# Patient Record
Sex: Female | Born: 1965 | Race: White | Hispanic: No | Marital: Married | Smoking: Never smoker
Health system: Southern US, Community
[De-identification: ages and names within clinical notes are randomized; demographics above are authoritative.]

## PROBLEM LIST (undated history)

## (undated) DIAGNOSIS — N83209 Unspecified ovarian cyst, unspecified side: Secondary | ICD-10-CM

## (undated) DIAGNOSIS — I10 Essential (primary) hypertension: Secondary | ICD-10-CM

## (undated) DIAGNOSIS — N809 Endometriosis, unspecified: Secondary | ICD-10-CM

## (undated) DIAGNOSIS — J309 Allergic rhinitis, unspecified: Secondary | ICD-10-CM

## (undated) DIAGNOSIS — F419 Anxiety disorder, unspecified: Secondary | ICD-10-CM

## (undated) DIAGNOSIS — L309 Dermatitis, unspecified: Secondary | ICD-10-CM

## (undated) HISTORY — DX: Unspecified ovarian cyst, unspecified side: N83.209

## (undated) HISTORY — DX: Endometriosis, unspecified: N80.9

## (undated) HISTORY — DX: Anxiety disorder, unspecified: F41.9

## (undated) HISTORY — DX: Allergic rhinitis, unspecified: J30.9

## (undated) HISTORY — DX: Essential (primary) hypertension: I10

## (undated) HISTORY — DX: Dermatitis, unspecified: L30.9

---

## 2002-10-11 ENCOUNTER — Ambulatory Visit (HOSPITAL_COMMUNITY): Admission: RE | Admit: 2002-10-11 | Discharge: 2002-10-11 | Payer: Self-pay | Admitting: Obstetrics and Gynecology

## 2002-10-11 ENCOUNTER — Encounter: Payer: Self-pay | Admitting: Obstetrics and Gynecology

## 2003-01-14 ENCOUNTER — Encounter: Admission: RE | Admit: 2003-01-14 | Discharge: 2003-01-14 | Payer: Self-pay | Admitting: Obstetrics and Gynecology

## 2003-01-20 ENCOUNTER — Ambulatory Visit (HOSPITAL_COMMUNITY): Admission: RE | Admit: 2003-01-20 | Discharge: 2003-01-20 | Payer: Self-pay | Admitting: Obstetrics and Gynecology

## 2003-01-20 ENCOUNTER — Encounter: Payer: Self-pay | Admitting: Obstetrics and Gynecology

## 2003-02-17 ENCOUNTER — Ambulatory Visit (HOSPITAL_COMMUNITY): Admission: RE | Admit: 2003-02-17 | Discharge: 2003-02-17 | Payer: Self-pay | Admitting: Obstetrics and Gynecology

## 2003-03-05 ENCOUNTER — Inpatient Hospital Stay (HOSPITAL_COMMUNITY): Admission: AD | Admit: 2003-03-05 | Discharge: 2003-03-05 | Payer: Self-pay | Admitting: Obstetrics and Gynecology

## 2003-03-06 ENCOUNTER — Inpatient Hospital Stay (HOSPITAL_COMMUNITY): Admission: AD | Admit: 2003-03-06 | Discharge: 2003-03-08 | Payer: Self-pay | Admitting: Obstetrics and Gynecology

## 2003-06-24 ENCOUNTER — Other Ambulatory Visit: Admission: RE | Admit: 2003-06-24 | Discharge: 2003-06-24 | Payer: Self-pay | Admitting: Obstetrics and Gynecology

## 2004-06-02 ENCOUNTER — Ambulatory Visit (HOSPITAL_COMMUNITY): Admission: RE | Admit: 2004-06-02 | Discharge: 2004-06-02 | Payer: Self-pay | Admitting: Obstetrics and Gynecology

## 2004-07-02 ENCOUNTER — Other Ambulatory Visit: Admission: RE | Admit: 2004-07-02 | Discharge: 2004-07-02 | Payer: Self-pay | Admitting: Obstetrics and Gynecology

## 2004-08-06 ENCOUNTER — Ambulatory Visit (HOSPITAL_COMMUNITY): Admission: RE | Admit: 2004-08-06 | Discharge: 2004-08-06 | Payer: Self-pay | Admitting: Obstetrics and Gynecology

## 2004-12-16 ENCOUNTER — Inpatient Hospital Stay (HOSPITAL_COMMUNITY): Admission: AD | Admit: 2004-12-16 | Discharge: 2004-12-16 | Payer: Self-pay | Admitting: Obstetrics and Gynecology

## 2004-12-18 ENCOUNTER — Inpatient Hospital Stay (HOSPITAL_COMMUNITY): Admission: AD | Admit: 2004-12-18 | Discharge: 2004-12-18 | Payer: Self-pay | Admitting: Obstetrics and Gynecology

## 2004-12-29 ENCOUNTER — Inpatient Hospital Stay (HOSPITAL_COMMUNITY): Admission: AD | Admit: 2004-12-29 | Discharge: 2004-12-29 | Payer: Self-pay | Admitting: Obstetrics and Gynecology

## 2005-01-02 ENCOUNTER — Inpatient Hospital Stay (HOSPITAL_COMMUNITY): Admission: AD | Admit: 2005-01-02 | Discharge: 2005-01-04 | Payer: Self-pay | Admitting: Obstetrics and Gynecology

## 2010-06-16 ENCOUNTER — Ambulatory Visit: Payer: Self-pay | Admitting: General Practice

## 2012-01-23 ENCOUNTER — Encounter: Payer: Self-pay | Admitting: Obstetrics and Gynecology

## 2012-06-13 ENCOUNTER — Ambulatory Visit: Payer: Self-pay | Admitting: Obstetrics and Gynecology

## 2012-07-03 ENCOUNTER — Ambulatory Visit: Payer: Self-pay | Admitting: Medical

## 2012-12-18 ENCOUNTER — Encounter: Payer: Self-pay | Admitting: Family Medicine

## 2012-12-25 ENCOUNTER — Ambulatory Visit (INDEPENDENT_AMBULATORY_CARE_PROVIDER_SITE_OTHER): Payer: BC Managed Care – PPO | Admitting: Family Medicine

## 2012-12-25 ENCOUNTER — Encounter: Payer: Self-pay | Admitting: Family Medicine

## 2012-12-25 VITALS — BP 138/84 | Temp 98.2°F | Wt 182.1 lb

## 2012-12-25 DIAGNOSIS — L259 Unspecified contact dermatitis, unspecified cause: Secondary | ICD-10-CM

## 2012-12-25 DIAGNOSIS — J309 Allergic rhinitis, unspecified: Secondary | ICD-10-CM

## 2012-12-25 DIAGNOSIS — Q828 Other specified congenital malformations of skin: Secondary | ICD-10-CM

## 2012-12-25 DIAGNOSIS — I10 Essential (primary) hypertension: Secondary | ICD-10-CM

## 2012-12-25 DIAGNOSIS — L309 Dermatitis, unspecified: Secondary | ICD-10-CM

## 2012-12-25 DIAGNOSIS — J302 Other seasonal allergic rhinitis: Secondary | ICD-10-CM

## 2012-12-25 DIAGNOSIS — R002 Palpitations: Secondary | ICD-10-CM

## 2012-12-25 DIAGNOSIS — L858 Other specified epidermal thickening: Secondary | ICD-10-CM

## 2012-12-25 MED ORDER — FLUTICASONE PROPIONATE 50 MCG/ACT NA SUSP: 1.0000 | Freq: Every day | NASAL | Status: DC

## 2012-12-25 MED ORDER — PIMECROLIMUS 1 % EX CREA: TOPICAL_CREAM | Freq: Two times a day (BID) | CUTANEOUS | Status: DC

## 2012-12-25 MED ORDER — SALICYLIC ACID-CLEANSER 6 % LOTION EX KIT: PACK | CUTANEOUS | Status: DC

## 2012-12-25 MED ORDER — TRIAMTERENE-HCTZ 37.5-25 MG PO TABS: 1.0000 | ORAL_TABLET | Freq: Every day | ORAL | Status: DC

## 2012-12-25 MED ORDER — METOPROLOL SUCCINATE ER 25 MG PO TB24: 25.0000 mg | ORAL_TABLET | Freq: Every day | ORAL | Status: DC

## 2012-12-25 NOTE — Patient Instructions (Addendum)
Allergic Rhinitis Allergic rhinitis is when the mucous membranes in the nose respond to allergens. Allergens are particles in the air that cause your body to have an allergic reaction. This causes you to release allergic antibodies. Through a chain of events, these eventually cause you to release histamine into the blood stream (hence the use of antihistamines). Although meant to be protective to the body, it is this release that causes your discomfort, such as frequent sneezing, congestion and an itchy runny nose.  CAUSES  The pollen allergens may come from grasses, trees, and weeds. This is seasonal allergic rhinitis, or "hay fever." Other allergens cause year-round allergic rhinitis (perennial allergic rhinitis) such as house dust mite allergen, pet dander and mold spores.  SYMPTOMS   Nasal stuffiness (congestion).  Runny, itchy nose with sneezing and tearing of the eyes.  There is often an itching of the mouth, eyes and ears. It cannot be cured, but it can be controlled with medications. DIAGNOSIS  If you are unable to determine the offending allergen, skin or blood testing may find it. TREATMENT   Avoid the allergen.  Medications and allergy shots (immunotherapy) can help.  Hay fever may often be treated with antihistamines in pill or nasal spray forms. Antihistamines block the effects of histamine. There are over-the-counter medicines that may help with nasal congestion and swelling around the eyes. Check with your caregiver before taking or giving this medicine. If the treatment above does not work, there are many new medications your caregiver can prescribe. Stronger medications may be used if initial measures are ineffective. Desensitizing injections can be used if medications and avoidance fails. Desensitization is when a patient is given ongoing shots until the body becomes less sensitive to the allergen. Make sure you follow up with your caregiver if problems continue. SEEK MEDICAL  CARE IF:   You develop fever (more than 100.5 F (38.1 C).  You develop a cough that does not stop easily (persistent).  You have shortness of breath.  You start wheezing.  Symptoms interfere with normal daily activities. Document Released: 12/21/2000 Document Revised: 06/20/2011 Document Reviewed: 07/02/2008 ExitCare Patient Information 2014 [employer].  

## 2012-12-25 NOTE — Progress Notes (Signed)
Subjective:    Patient ID: Tracey Mcdaniel, female    DOB: [birth year], 47 y.o.   MRN: [id number]  HPI Pt here for refills on her medicines. She has seen dr. Milford Cage in the past for medication management. 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 She is feeling well today and has no concerns aside from needing meds filled.   1 - keratosis pilaris - uses salicyclic acid cream but would like to switch to lotion. No side effects.  2 - eczema - controlled with lotion and elidel  3 - h/o ovarian cysts and endometriosis - Had been on Lybrel OCP but insurance changed to generic of this medicine. Pt does not like the generic as she feels it is not as helpful against her sx. Does not need a refill currently but plans to go back on the lybrel even though it will be more expensive.   4 - HTN - BP today 138/84, says she thinks it was [facility]. No headaches or blurry vision. On Maxide for this, and also on metoprolol for this and palpitations. Was told by nurse at [facility] that her palpitations (see below) were due to a "vitamin and mineral imbalance" and so started pt on potassium and Magnesium supplements. Pt says her labs were checked at some point but is unsure if it was before of after starting these supplements.   5 - Allergic rhinitis - takes Claritin prn. Used flonase at one point but always got epistaxis from it. Eyes itch at times as well.   6 - palpitations - longstanding, worse with stress, most recently 2 weeks ago, sometimea accompanied by chest pressure but not actual pain. On metoprolol ER 25mg  but takes extra tab sometimes if her palpitations are really bothering her. Prefers the brand name metoprolol ER to the generic as she feels it works better for her.   7 - h/o anxiety - was on zoloft but weaned herself off after her mother in law passed away. She did not like the side effects such as weight gain  and feels she does not need it anymore. No decrease in energy, sleep, concentration, or increase in anxiety. No SI/HI    Review of Systemsper hpi     Objective:   Physical Exam  Nursing note and vitals reviewed. Constitutional: She appears well-developed and well-nourished.  HENT:  Head: Normocephalic.  Right Ear: External ear normal.  Left Ear: External ear normal.  Neck: Normal range of motion.  Cardiovascular: Normal rate, regular rhythm and normal heart sounds.   Pulmonary/Chest: Effort normal and breath sounds normal.  Abdominal: Soft. Bowel sounds are normal.  Skin: Skin is warm and dry.  Psychiatric: She has a normal mood and affect.          Assessment & Plan:    1 - keratosis pilaris - refilled salycilate in lotion form  2 - eczema - refilled elidel, encouraged moisturizing  3 - h/o ovarian cysts and endometriosis - no meds prescribed today but if she calls needing refill that would be fine within the next year.   4 - HTN - refilled maxide, have asked her to record her BPs 3-4 times over the next week or two and let me know what they are. I am concerned about the seemingly unmonitored K and Mag - will check BMP, phos, and mag today. She continues to take thes supplements  daily.   5 - Allergic rhinitis - refilled flonase and encouraged her to aim toward ipsilateral eyeball and look down. Start with alternate day dosing. claritin prn. Call if she needs eye drops.   6 - palpitations - Have referred her to cardiology for evaluation of her palpitations with chest pressure.   7 - h/o anxiety - ok to remain off meds for now. Discussed sx that could occur letting her know she needed the meds again.   Pt says she gets all [facility]. Plans to move her gyn care to our practice. Will see her in 1 year for med check unless needed earlier.

## 2013-01-10 ENCOUNTER — Encounter: Payer: BC Managed Care – PPO | Admitting: Cardiology

## 2013-01-10 ENCOUNTER — Encounter: Payer: Self-pay | Admitting: Cardiology

## 2013-01-10 DIAGNOSIS — I10 Essential (primary) hypertension: Secondary | ICD-10-CM | POA: Insufficient documentation

## 2013-01-10 DIAGNOSIS — R002 Palpitations: Secondary | ICD-10-CM | POA: Insufficient documentation

## 2013-01-10 NOTE — Progress Notes (Signed)
NNo-show. This encounter was created in error - please disregard. 

## 2013-02-04 ENCOUNTER — Ambulatory Visit: Payer: BC Managed Care – PPO | Admitting: Cardiovascular Disease

## 2013-03-06 ENCOUNTER — Other Ambulatory Visit: Payer: Self-pay | Admitting: Family Medicine

## 2013-03-06 ENCOUNTER — Telehealth: Payer: Self-pay | Admitting: *Deleted

## 2013-03-06 NOTE — Telephone Encounter (Signed)
I have 3 questions. 1 - has she seen cardiology yet? i placed the referral 9/16 and dont see that she has gone. The purpose was largely to evaluate her palpitations which is part of why she is on the toprol  2 - yes, 2 tabs per day is 60 tabs per month which is what i wrote for. I gave her 2 refills. No sense in getting 90 day supply until she sees cardiology as they may make changes. If she remains on this dose of this medicine then i am happy to give her 180 tabs at a time which would be a 90 day supply.   3 - why is this coming up now? It has been over 2 mos since i saw her.   Thanks AW

## 2013-03-06 NOTE — Telephone Encounter (Signed)
Pt called and left VM stating that MD sent Toprol 25 mg QD to pharmacy and she received 60 pills. She stated that she takes Toprol 25mg  two pills a day and needed a 3 month supply for insurance reasons. Will route to MD.

## 2013-04-02 ENCOUNTER — Telehealth: Payer: Self-pay | Admitting: Family Medicine

## 2013-04-02 NOTE — Telephone Encounter (Signed)
Please let pharmacy know that pts toprol xl was prscribed to be twice daily. She does not need it refilled but wants it corrected in their system as her bottle showed it to be prescribed once daily. Thanks AW

## 2013-04-02 NOTE — Telephone Encounter (Signed)
Spoke with pharmacy they stated that info is correct in their system that for some reason she had the once a day filled on 02/14/2013 but they received the 25 mg BID order on 03/06/2013 and it is there in pharmacy.

## 2013-04-02 NOTE — Telephone Encounter (Signed)
MD addressed in office

## 2013-04-02 NOTE — Telephone Encounter (Signed)
Pt came in asking about Rx for Toprol 90 day supply.  She has an appt 04/10/13 w/ cardiology.  For insurance reasons she wants the 90 days supply, it is cheaper.  She wanted to ask again that you would give her the 90 day supply.  She will be coming back this afternoon to bring her son for an appt.

## 2013-04-10 ENCOUNTER — Ambulatory Visit: Payer: BC Managed Care – PPO | Admitting: Cardiology

## 2013-04-23 ENCOUNTER — Encounter: Payer: Self-pay | Admitting: Cardiology

## 2013-04-23 ENCOUNTER — Ambulatory Visit (INDEPENDENT_AMBULATORY_CARE_PROVIDER_SITE_OTHER): Payer: BC Managed Care – PPO | Admitting: Cardiology

## 2013-04-23 VITALS — BP 154/100 | HR 104 | Ht 67.0 in | Wt 189.0 lb

## 2013-04-23 DIAGNOSIS — I10 Essential (primary) hypertension: Secondary | ICD-10-CM

## 2013-04-23 DIAGNOSIS — R002 Palpitations: Secondary | ICD-10-CM

## 2013-04-23 DIAGNOSIS — R9431 Abnormal electrocardiogram [ECG] [EKG]: Secondary | ICD-10-CM

## 2013-04-23 NOTE — Progress Notes (Signed)
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 Clydene Laming for evaluation of palpitations. 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 Medical history is noted below. She reports a long-standing history of intermittent palpitations, dating back at least to 2008. She has been managed with beta blocker therapy over time, initially was on propranolol but did not tolerate it well, switched then to Lopressor, and subsequently Toprol-XL. She has no clearly defined history of cardiac arrhythmia or structural heart disease.   She describes most of her palpitations occur during the daytime, no correlation to emotional stress and anxiety. She feels a "fluttering" with most events, sometimes a feeling of skips and irregular heartbeat when she checks her pulse peripherally. She does not report any associated dizziness or syncope with these events, no exertional chest pain or unusual shortness of breath. 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  ECG today shows sinus rhythm with short PR interval, does not have classic delta waves, decreased R wave progression. No old tracing for comparison.  She states that her father had an atrial fibrillation ablation at the [facility], now on flecainide. Not clear that has any other associated cardiac diagnoses.   No Known Allergies  Current Outpatient Prescriptions  Medication Sig Dispense Refill  . fluticasone (FLONASE) 50 MCG/ACT nasal spray Place 1 spray into the nose daily.  16 g  3  . levonorgestrel-ethinyl estradiol (LYBREL,AMETHYST) 90-20 MCG tablet Take 1 tablet by mouth daily.      . metoprolol succinate (TOPROL-XL) 25 MG 24 hr tablet TAKE ONE TABLET BY MOUTH TWICE A DAY FOR BLOOD PRESSURE  180 tablet  2  . Multiple Vitamins-Minerals (NICAZEL PO) Take 2 tablets by mouth daily.      . pimecrolimus (ELIDEL) 1 % cream  Apply topically 2 (two) times daily.  60 g  3  . Salicylic Acid 2 % CREA Apply topically.      . Salicylic Acid-Cleanser 6 % (LOTION) KIT Use as directed  1 kit  6  . triamterene-hydrochlorothiazide (MAXZIDE-25) 37.5-25 MG per tablet Take 1 tablet by mouth daily.  90 tablet  2   No current facility-administered medications for this visit.    Past Medical History  Diagnosis Date  . Essential hypertension, benign   . Anxiety   . Allergic rhinitis   . Endometriosis   . Ovarian cyst   . Eczema     History reviewed. No pertinent past surgical history.  Family History  Problem Relation Age of Onset  . Atrial fibrillation Father   . Arrhythmia Mother     States has "tachycardia" with panic attack    Social History Ms. Loma Messing reports that she has never smoked. She does not have any smokeless tobacco history on file. Ms. Loma Messing reports that she does not drink alcohol.  Review of Systems Outlined above. No cough, fevers or chills, orthopnea or PND. Does have varicose veins that are not symptomatic. Otherwise negative.  Physical Examination Filed Vitals:   04/23/13 0826  BP: 154/100  Pulse: 104   Filed Weights   04/23/13 0826  Weight: 189 lb (85.73 kg)   Patient appears comfortable at rest. HEENT: Conjunctiva and lids normal, oropharynx clear. Neck: Supple, no elevated JVP or carotid bruits, no thyromegaly. Lungs: Clear to auscultation, nonlabored breathing at rest. Cardiac: Regular rate and rhythm, no S3 or significant systolic murmur, no pericardial  rub. Abdomen: Soft, nontender, bowel sounds present. Extremities: No pitting edema, distal pulses 2+. Skin: Warm and dry. Musculoskeletal: No kyphosis. Neuropsychiatric: Alert and oriented x3, affect grossly appropriate, somewhat anxious in discussing her symptoms.   Problem List and Plan   Palpitations Long-standing history as outlined, no associated dizziness or syncope, no chest pain symptoms. Baseline ECG does show a  short PR interval, possibility of preexcitation to be considered although does not have classic delta waves. Could have a concealed pathway potentially. She has never worn a cardiac monitor, reports symptoms occurring no more than every few weeks to once a month. We will therefore place a 30 day event recorder as a start. No change in her beta blocker therapy for now. Followup arranged.  Abnormal EKG Short PR interval noted without classic delta waves. Baseline GXT to ensure stability in rhythm and intervals with exercise. She does not need to hold beta blocker.  Essential hypertension, benign Blood pressure elevated today, patient states this is typically not the case although her blood pressures are mildly elevated at baseline. She admits that she is somewhat anxious today. She reports compliance with her medications which includes a diuretic. We talked about increasing her walking regimen, also watching sodium in the diet - less than 2 g daily. She will bring in her most recent lab work to review electrolytes and see if she needs to be on a standing potassium supplement.    Satira Sark, M.D., F.A.C.C.

## 2013-04-23 NOTE — Assessment & Plan Note (Signed)
Short PR interval noted without classic delta waves. Baseline GXT to ensure stability in rhythm and intervals with exercise. She does not need to hold beta blocker.

## 2013-04-23 NOTE — Assessment & Plan Note (Signed)
Blood pressure elevated today, patient states this is typically not the case although her blood pressures are mildly elevated at baseline. She admits that she is somewhat anxious today. She reports compliance with her medications which includes a diuretic. We talked about increasing her walking regimen, also watching sodium in the diet - less than 2 g daily. She will bring in her most recent lab work to review electrolytes and see if she needs to be on a standing potassium supplement.

## 2013-04-23 NOTE — Patient Instructions (Addendum)
Your physician recommends that you schedule a follow-up appointment in: 1 month    Your physician has recommended that you wear an event monitor for 30 days . Event monitors are medical devices that record the heart's electrical activity. Doctors most often us these monitors to diagnose arrhythmias. Arrhythmias are problems with the speed or rhythm of the heartbeat. The monitor is a small, portable device. You can wear one while you do your normal daily activities. This is usually used to diagnose what is causing palpitations/syncope (passing out).   Your physician has requested that you have an exercise tolerance test. For further information please visit [email or web address]. Please also follow instruction sheet, as given. Please do NOT stop your bet Blocker before the tes

## 2013-04-23 NOTE — Assessment & Plan Note (Signed)
Long-standing history as outlined, no associated dizziness or syncope, no chest pain symptoms. Baseline ECG does show a short PR interval, possibility of preexcitation to be considered although does not have classic delta waves. Could have a concealed pathway potentially. She has never worn a cardiac monitor, reports symptoms occurring no more than every few weeks to once a month. We will therefore place a 30 day event recorder as a start. No change in her beta blocker therapy for now. Followup arranged.

## 2013-04-25 DIAGNOSIS — R002 Palpitations: Secondary | ICD-10-CM

## 2013-04-29 ENCOUNTER — Ambulatory Visit (HOSPITAL_COMMUNITY)
Admission: RE | Admit: 2013-04-29 | Discharge: 2013-04-29 | Disposition: A | Discharge status: Home / Self Care | Payer: BC Managed Care – PPO | Source: Ambulatory Visit | Attending: Cardiology | Admitting: Cardiology

## 2013-04-29 ENCOUNTER — Telehealth: Payer: Self-pay

## 2013-04-29 DIAGNOSIS — R9431 Abnormal electrocardiogram [ECG] [EKG]: Secondary | ICD-10-CM

## 2013-04-29 DIAGNOSIS — R002 Palpitations: Secondary | ICD-10-CM

## 2013-04-29 NOTE — Progress Notes (Addendum)
Stress Lab Nurses Notes - Tracey Mcdaniel  Tracey Mcdaniel 04/29/2013 Reason for doing test: Palpitations Type of test: Regular GTX Nurse performing test: Parke PoissonPhyllis Billingsly, RN Nuclear Medicine Tech: Not Applicable Echo Tech: Not Applicable MD performing test: Ival BibleS. McDowell /K.Lyman BishopLawrence NP Family MD: Dr. Lucretia RoersWood Test explained and consent signed: yes IV started: No IV started Symptoms: Fatigue Treatment/Intervention: None Reason test stopped: fatigue and reached target HR After recovery IV was: NA Patient to return to Nuc. Med at :NA Patient discharged: Home Patient's Condition upon discharge was: stable Comments: During test peak BP 196/103 & HR 190.  Recovery BP 153/89 & HR 110.  Symptoms resolved in recovery.  Erskine SpeedBillingsley, Phyllis T  Patient was exercised according to the Bruce protocol for 9 min 43 seconds achieving 12.40 METs. Resting heart increased from 100 bpm to 190 bpm (108% of THR) and resting blood pressure increased from 142/93 to 196/103. The test was stopped due to fatigue, she did not have any chest pain. Baseline EKG showed normal sinus rhythm, short PR interval. During exercise there was non-specific upsloping ST segment depression in the inferior leads. No significant arrhythmias. There were occasional PVCs during recovery. There is no evidence of pre-excitation during stress, at peak heart rates patient remains in sinus rhythm with narrow QRS complex consistent with her baseline QRS.   Conclusions 1. Negative exercise stress test for ischemia 2. Duke treadmill score 10 consistent with low risk for major cardiac events 3. Excellent functional capactiy (140% of predicted based on age and gender) 4. No evidence of arrhythmia or preexcitation   Dina RichJonathan Branch MD

## 2013-04-29 NOTE — Telephone Encounter (Signed)
I reviewed the GXT report. Please let her know that she did well and the test was normal. ----- Message ----- From: Antoine PocheJonathan F Branch, MD Sent: 04/29/2013 4:15 PM To: Jonelle SidleSamuel G McDowell, MD Subject: Inpatient Notes   LM on pts home number that GXT was normal

## 2013-05-29 ENCOUNTER — Telehealth: Payer: Self-pay

## 2013-05-29 NOTE — Telephone Encounter (Signed)
Received End of Service report form [facility],placed in Dr.McDowells folder for review

## 2013-06-03 ENCOUNTER — Ambulatory Visit (INDEPENDENT_AMBULATORY_CARE_PROVIDER_SITE_OTHER): Payer: BC Managed Care – PPO | Admitting: Cardiology

## 2013-06-03 ENCOUNTER — Ambulatory Visit: Payer: BC Managed Care – PPO | Admitting: Cardiology

## 2013-06-03 ENCOUNTER — Encounter: Payer: Self-pay | Admitting: Cardiology

## 2013-06-03 VITALS — BP 148/88 | HR 91 | Ht 67.0 in | Wt 188.0 lb

## 2013-06-03 DIAGNOSIS — R002 Palpitations: Secondary | ICD-10-CM

## 2013-06-03 DIAGNOSIS — I10 Essential (primary) hypertension: Secondary | ICD-10-CM

## 2013-06-03 MED ORDER — METOPROLOL SUCCINATE ER 25 MG PO TB24: ORAL_TABLET | ORAL | Status: DC

## 2013-06-03 NOTE — Progress Notes (Signed)
Clinical Summary [name] is a 48 y.o.female last seen in January. Testing was arranged at that time for further investigation of described palpitations, short PR interval by ECG but no definitive delta waves.   GXT done in January of this year was negative for ischemia with Duke treadmill score of 10 consistent with low risk. She had good functional capacity, no arrhythmias were documented. Cardiac monitor showed sinus rhythm and probable sinus tachycardia (heart rate 160s while playing basketball), occasional to at times frequent PVCs also noted. No definitive evidence of PSVT other than sinus.  Today we discussed the results of her testing. She still feels occasional palpitations, takes Toprol-XL 50 mg in the morning. Resting heart rate is generally in the 80s to 90s. She avoids caffeine, no other stimulant medications. Has been trying to walk up to an hour each day, trying to work on stress management. We discussed up titration of her beta blocker, continuing observation for now.   No Known Allergies  Current Outpatient Prescriptions  Medication Sig Dispense Refill  . fluticasone (FLONASE) 50 MCG/ACT nasal spray Place 1 spray into the nose daily.  16 g  3  . levonorgestrel-ethinyl estradiol (LYBREL,AMETHYST) 90-20 MCG tablet Take 1 tablet by mouth daily.      . Multiple Vitamins-Minerals (NICAZEL PO) Take 2 tablets by mouth daily.      . pimecrolimus (ELIDEL) 1 % cream Apply topically 2 (two) times daily.  60 g  3  . Salicylic Acid 2 % CREA Apply topically.      . Salicylic Acid-Cleanser 6 % (LOTION) KIT Use as directed  1 kit  6  . triamterene-hydrochlorothiazide (MAXZIDE-25) 37.5-25 MG per tablet Take 1 tablet by mouth daily.  90 tablet  2  . metoprolol succinate (TOPROL XL) 25 MG 24 hr tablet Take 2 tabs (16m total) in am and 25 mg hs  90 tablet  6   No current facility-administered medications for this visit.    Past Medical History  Diagnosis Date  . Essential hypertension,  benign   . Anxiety   . Allergic rhinitis   . Endometriosis   . Ovarian cyst   . Eczema     Social History Ms. Tracey Messingreports that she has never smoked. She does not have any smokeless tobacco history on file. Ms. Tracey Messingreports that she does not drink alcohol.  Review of Systems Negative except as outlined.  Physical Examination Filed Vitals:   06/03/13 1619  BP: 148/88  Pulse: 91   Filed Weights   06/03/13 1619  Weight: 188 lb (85.276 kg)    Patient appears comfortable at rest.  HEENT: Conjunctiva and lids normal, oropharynx clear.  Neck: Supple, no elevated JVP or carotid bruits, no thyromegaly.  Lungs: Clear to auscultation, nonlabored breathing at rest.  Cardiac: Regular rate and rhythm, no S3 or significant systolic murmur, no pericardial rub.  Abdomen: Soft, nontender, bowel sounds present.  Extremities: No pitting edema, distal pulses 2+.  Skin: Warm and dry.  Musculoskeletal: No kyphosis.  Neuropsychiatric: Alert and oriented x3, affect grossly appropriate.   Problem List and Plan   Palpitations No definitive PSVT by recent monitoring, sinus rhythm and sinus tachycardia noted. GXT did not show any inducible arrhythmias, no exenteration PVCs with exercise. Expect that she is having symptomatic PVCs based on monitoring. No ischemia by GXT with good functional capacity. For now will advance Toprol-XL to 50 mg in the morning and 25 mg in the evening. May need to go higher.  Continue regular walking regimen, avoid caffeine and stimulants. Followup in 3 months.  Essential hypertension, benign Continue to monitor. Beta blocker dose being increased as noted. Keep follow up with primary care.    Satira Sark, M.D., F.A.C.C.

## 2013-06-03 NOTE — Patient Instructions (Addendum)
Your physician recommends that you schedule a follow-up appointment in: three months   Your physician has recommended you make the following change in your medication:    Take Toprol XL 50 mg am, and 25 mg at night   Thank you for choosing [facility] !

## 2013-06-03 NOTE — Assessment & Plan Note (Signed)
Continue to monitor. Beta blocker dose being increased as noted. Keep follow up with primary care.

## 2013-06-03 NOTE — Assessment & Plan Note (Signed)
No definitive PSVT by recent monitoring, sinus rhythm and sinus tachycardia noted. GXT did not show any inducible arrhythmias, no exenteration PVCs with exercise. Expect that she is having symptomatic PVCs based on monitoring. No ischemia by GXT with good functional capacity. For now will advance Toprol-XL to 50 mg in the morning and 25 mg in the evening. May need to go higher. Continue regular walking regimen, avoid caffeine and stimulants. Followup in 3 months.

## 2013-06-05 ENCOUNTER — Other Ambulatory Visit: Payer: Self-pay | Admitting: *Deleted

## 2013-06-05 DIAGNOSIS — R9431 Abnormal electrocardiogram [ECG] [EKG]: Secondary | ICD-10-CM

## 2013-06-05 DIAGNOSIS — R002 Palpitations: Secondary | ICD-10-CM

## 2013-07-04 ENCOUNTER — Telehealth: Payer: Self-pay | Admitting: *Deleted

## 2013-07-04 MED ORDER — METOPROLOL SUCCINATE ER 25 MG PO TB24: ORAL_TABLET | ORAL | Status: DC

## 2013-07-04 NOTE — Telephone Encounter (Signed)
Pt had a change in metoprolol at last visit and needs new RX called in to [facility] in [place]. Pt is requesting three month supply she get discount through insurance company.

## 2013-07-04 NOTE — Telephone Encounter (Signed)
Refill request complete 

## 2013-09-03 ENCOUNTER — Encounter: Payer: Self-pay | Admitting: Cardiology

## 2013-09-03 ENCOUNTER — Ambulatory Visit (INDEPENDENT_AMBULATORY_CARE_PROVIDER_SITE_OTHER): Payer: BC Managed Care – PPO | Admitting: Cardiology

## 2013-09-03 VITALS — BP 142/98 | HR 82 | Ht 67.0 in | Wt 190.0 lb

## 2013-09-03 DIAGNOSIS — I10 Essential (primary) hypertension: Secondary | ICD-10-CM

## 2013-09-03 MED ORDER — METOPROLOL SUCCINATE ER 100 MG PO TB24: 100.0000 mg | ORAL_TABLET | Freq: Every day | ORAL | Status: DC

## 2013-09-03 NOTE — Assessment & Plan Note (Signed)
Improved on higher dose Toprol-XL. We will change this to 100 mg once in the morning and see how she does. Otherwise continue regular exercise, limit caffeine.

## 2013-09-03 NOTE — Progress Notes (Signed)
    Clinical Summary [name] is a 48 y.o.female last seen in February of this year. Since that time she has increased Toprol-XL to 75 mg in the morning, did have improvement in palpitations with this change. Sometimes she takes Toprol-XL 25 mg in the evening, although states that this did not change her symptoms appreciably. She continues to work full-time at [employer]. Has been trying to walk for exercise but would like to increase her exercise plan somewhat. She is trying to lose some weight. Diet is low in salt based on discussion today. We did discuss her blood pressure.  GXT done in January of this year was negative for ischemia with Duke treadmill score of 10 consistent with low risk. She had good functional capacity, no arrhythmias were documented. Cardiac monitor showed sinus rhythm and probable sinus tachycardia (heart rate 160s while playing basketball), occasional to at times frequent PVCs also noted. No definitive evidence of PSVT other than sinus.   No Known Allergies  Current Outpatient Prescriptions  Medication Sig Dispense Refill  . doxycycline (ORACEA) 40 MG capsule Take 40 mg by mouth every morning.      . fluticasone (FLONASE) 50 MCG/ACT nasal spray Place 1 spray into the nose daily.  16 g  3  . levonorgestrel-ethinyl estradiol (LYBREL,AMETHYST) 90-20 MCG tablet Take 1 tablet by mouth daily.      . Multiple Vitamins-Minerals (NICAZEL PO) Take 2 tablets by mouth daily.      . pimecrolimus (ELIDEL) 1 % cream Apply topically 2 (two) times daily.  60 g  3  . Salicylic Acid 2 % CREA Apply topically.      . triamterene-hydrochlorothiazide (MAXZIDE-25) 37.5-25 MG per tablet Take 1 tablet by mouth daily.  90 tablet  2  . metoprolol succinate (TOPROL XL) 100 MG 24 hr tablet Take 1 tablet (100 mg total) by mouth daily. Take with or immediately following a meal.  90 tablet  3   No current facility-administered medications for this visit.    Past Medical History  Diagnosis Date  .  Essential hypertension, benign   . Anxiety   . Allergic rhinitis   . Endometriosis   . Ovarian cyst   . Eczema     Social History [name] reports that she has never smoked. She does not have any smokeless tobacco history on file. [name] reports that she does not drink alcohol.  Review of Systems Negative except as outlined.   Physical Examination Filed Vitals:   09/03/13 0902  BP: 142/98  Pulse: 82   Filed Weights   09/03/13 0902  Weight: 190 lb (86.183 kg)    Patient appears comfortable at rest.  HEENT: Conjunctiva and lids normal, oropharynx clear.  Neck: Supple, no elevated JVP or carotid bruits, no thyromegaly.  Lungs: Clear to auscultation, nonlabored breathing at rest.  Cardiac: Regular rate and rhythm, no S3 or significant systolic murmur, no pericardial rub.  Abdomen: Soft, nontender, bowel sounds present.  Extremities: No pitting edema, distal pulses 2+.    Problem List and Plan   Palpitations Improved on higher dose Toprol-XL. We will change this to 100 mg once in the morning and see how she does. Otherwise continue regular exercise, limit caffeine.  Essential hypertension, benign Plans to increase her exercise with a goal of weight loss, hopefully 10 pounds over the next 6 months. Low salt diet. Continue medical therapy.    Jonelle Sidle, M.D., F.A.C.C.

## 2013-09-03 NOTE — Patient Instructions (Signed)
Your physician wants you to follow-up in: 6 months You will receive a reminder letter in the mail two months in advance. If you don't receive a letter, please call our office to schedule the follow-up appointment.   Your physician has recommended you make the following change in your medication:    Please take Toprol XL 100 mg daily    Thank you for choosing [facility] !

## 2013-09-03 NOTE — Assessment & Plan Note (Signed)
Plans to increase her exercise with a goal of weight loss, hopefully 10 pounds over the next 6 months. Low salt diet. Continue medical therapy.

## 2013-11-29 ENCOUNTER — Other Ambulatory Visit: Payer: Self-pay | Admitting: Cardiology

## 2013-12-27 ENCOUNTER — Telehealth: Payer: Self-pay | Admitting: *Deleted

## 2013-12-27 MED ORDER — TRIAMTERENE-HCTZ 37.5-25 MG PO TABS: 1.0000 | ORAL_TABLET | Freq: Every day | ORAL | Status: DC

## 2013-12-27 NOTE — Telephone Encounter (Signed)
triamterene-hydrochlorothiazide (MAXZIDE-25) 37.5-25 MG per  PT NEEDS A 3 MONTH SUPPLY CALLED IN.

## 2013-12-27 NOTE — Telephone Encounter (Signed)
90 supply complete

## 2014-03-13 ENCOUNTER — Ambulatory Visit (INDEPENDENT_AMBULATORY_CARE_PROVIDER_SITE_OTHER): Payer: BC Managed Care – PPO | Admitting: Cardiology

## 2014-03-13 ENCOUNTER — Encounter: Payer: Self-pay | Admitting: Cardiology

## 2014-03-13 VITALS — BP 132/80 | HR 82 | Ht 67.0 in | Wt 178.4 lb

## 2014-03-13 DIAGNOSIS — R002 Palpitations: Secondary | ICD-10-CM

## 2014-03-13 DIAGNOSIS — I10 Essential (primary) hypertension: Secondary | ICD-10-CM

## 2014-03-13 NOTE — Assessment & Plan Note (Signed)
Much better controlled on current dose of Toprol XL 100 mg daily. No changes made today.

## 2014-03-13 NOTE — Assessment & Plan Note (Signed)
Blood pressure trend is better. Continue Toprol-XL and Maxide. I encouraged her to continue with her regular exercise and diet plan, she has lost 12 pounds.

## 2014-03-13 NOTE — Progress Notes (Signed)
   Reason for visit: Follow-up palpitations, hypertension  Clinical Summary Tracey Mcdaniel is a 48 y.o.female last seen in May. At that time Toprol-XL was increased to 100 mg daily. She states that she feels much better, very infrequent palpitations. She also has more energy, has been exercising, walks quite a bit. She has lost 12 pounds and I congratulated her on this. 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  GXT done in January of this year was negative for ischemia with Duke treadmill score of 10 consistent with low risk. She had good functional capacity, no arrhythmias were documented. Cardiac monitor showed sinus rhythm and probable sinus tachycardia (heart rate 160s while playing basketball), occasional to at times frequent PVCs also noted. No definitive evidence of PSVT other than sinus.  No Known Allergies  Current Outpatient Prescriptions  Medication Sig Dispense Refill  . doxycycline (ORACEA) 40 MG capsule Take 40 mg by mouth every morning.    Marland Kitchen. levonorgestrel-ethinyl estradiol (LYBREL,AMETHYST) 90-20 MCG tablet Take 1 tablet by mouth daily.    . metoprolol succinate (TOPROL XL) 100 MG 24 hr tablet Take 1 tablet (100 mg total) by mouth daily. Take with or immediately following a meal. 90 tablet 3  . Multiple Vitamins-Minerals (NICAZEL PO) Take 2 tablets by mouth daily.    . pimecrolimus (ELIDEL) 1 % cream Apply topically 2 (two) times daily. 60 g 3  . Salicylic Acid 2 % CREA Apply topically.    . triamterene-hydrochlorothiazide (MAXZIDE-25) 37.5-25 MG per tablet Take 1 tablet by mouth daily. 90 tablet 3  . fluticasone (FLONASE) 50 MCG/ACT nasal spray Place 1 spray into the nose daily. 16 g 3   No current facility-administered medications for this visit.    Past Medical History  Diagnosis Date  . Essential hypertension, benign   . Anxiety   . Allergic rhinitis   . Endometriosis   . Ovarian cyst   . Eczema     Social History Tracey Mcdaniel reports  that she has never smoked. She does not have any smokeless tobacco history on file. Tracey Mcdaniel reports that she does not drink alcohol.  Review of Systems Complete review of systems negative except as otherwise outlined in the clinical summary.  Physical Examination Filed Vitals:   03/13/14 0920  BP: 132/80  Pulse: 82   Filed Weights   03/13/14 0920  Weight: 178 lb 6.4 oz (80.922 kg)    Patient appears comfortable at rest.  HEENT: Conjunctiva and lids normal, oropharynx clear.  Neck: Supple, no elevated JVP or carotid bruits, no thyromegaly.  Lungs: Clear to auscultation, nonlabored breathing at rest.  Cardiac: Regular rate and rhythm, no S3 or significant systolic murmur, no pericardial rub.  Abdomen: Soft, nontender, bowel sounds present.  Extremities: No pitting edema, distal pulses 2+.    Problem List and Plan   Palpitations Much better controlled on current dose of Toprol XL 100 mg daily. No changes made today.  Essential hypertension, benign Blood pressure trend is better. Continue Toprol-XL and Maxide. I encouraged her to continue with her regular exercise and diet plan, she has lost 12 pounds.    Jonelle SidleSamuel G. Brynn Reznik, M.D., F.A.C.C.

## 2014-03-13 NOTE — Patient Instructions (Signed)
Your physician wants you to follow-up in: 6 months You will receive a reminder letter in the mail two months in advance. If you don't receive a letter, please call our office to schedule the follow-up appointment.     Your physician recommends that you continue on your current medications as directed. Please refer to the Current Medication list given to you today.      Thank you for choosing [facility] !        

## 2014-07-23 ENCOUNTER — Ambulatory Visit
Admit: 2014-07-23 | Disposition: A | Discharge status: Home / Self Care | Payer: Self-pay | Attending: Medical | Admitting: Medical

## 2014-08-22 ENCOUNTER — Other Ambulatory Visit: Payer: Self-pay | Admitting: Cardiology

## 2014-08-27 ENCOUNTER — Ambulatory Visit: Payer: Self-pay | Admitting: Cardiology

## 2014-08-28 ENCOUNTER — Ambulatory Visit: Payer: Self-pay | Admitting: Cardiology

## 2014-09-13 IMAGING — US ULTRASOUND LEFT BREAST
1 series · 13 of 24 positions shown · non-contrast
Comparison: 06/16/2010.

REASON FOR EXAM: [DATE] AND 11 OCLOCK CYST LT BRST AND YRLY
COMMENTS:

PROCEDURE:     US  - US BREAST LEFT  - July 03, 2012 [DATE]
RESULT:

[Series 1: ultrasound left breast · 0.09mm/px · 13 of 24 slices shown]
[im 1/24]
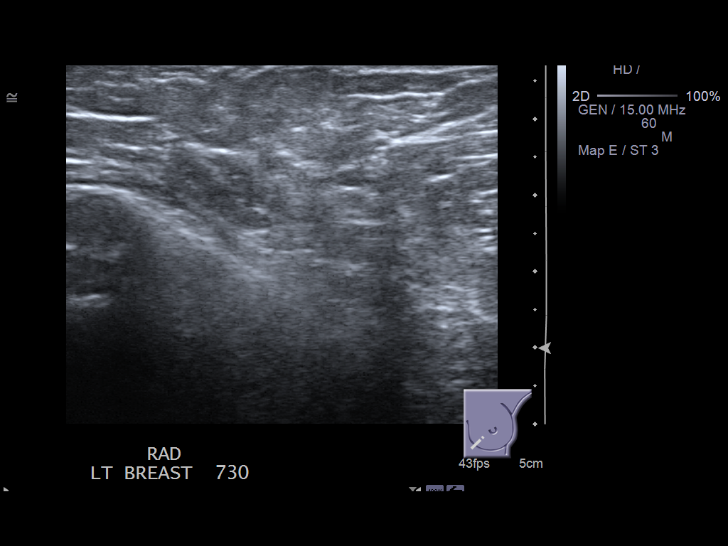
[im 3/24]
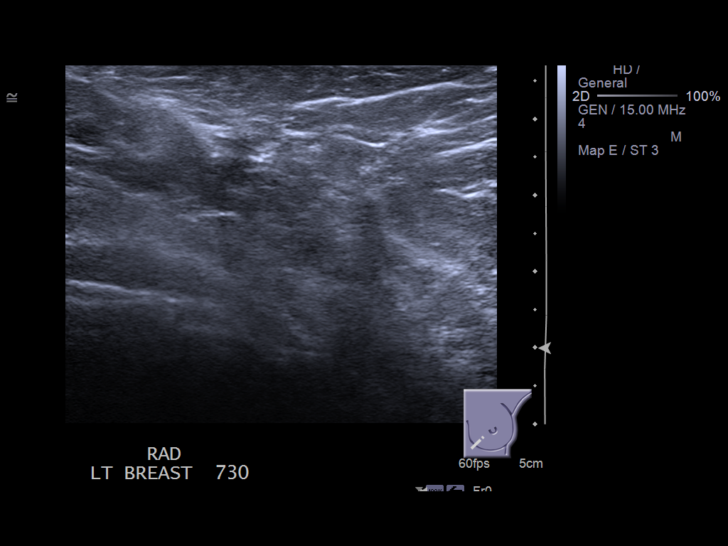
[im 5/24]
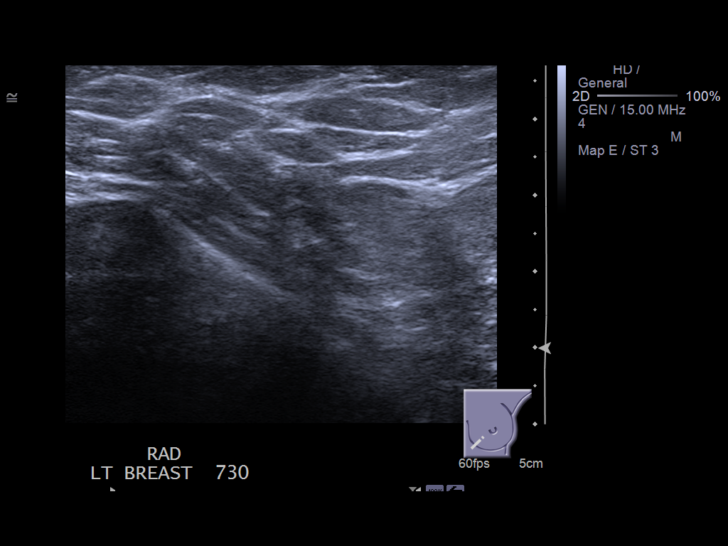
[im 7/24]
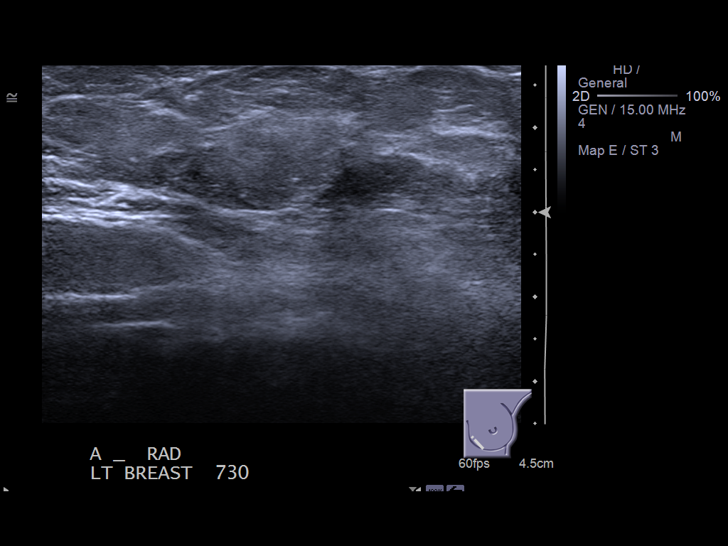
[im 9/24]
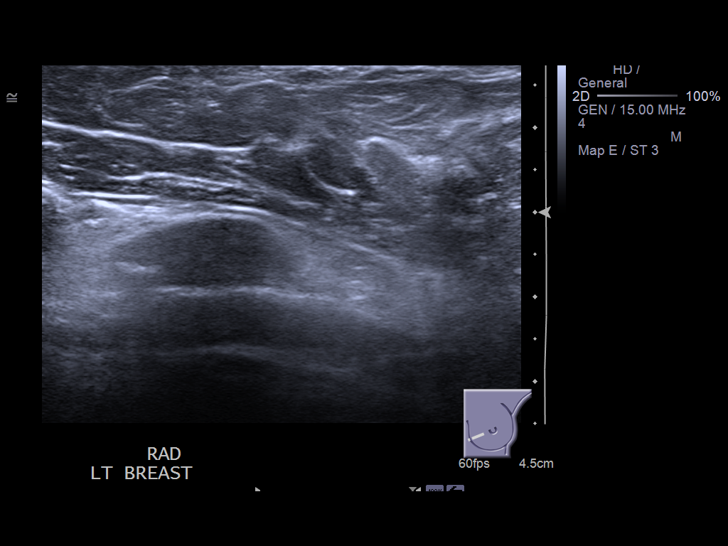
[im 11/24]
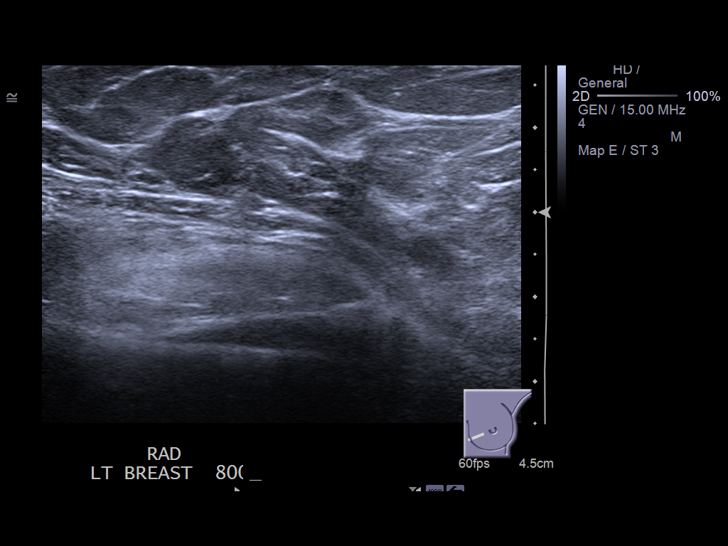
[im 13/24]
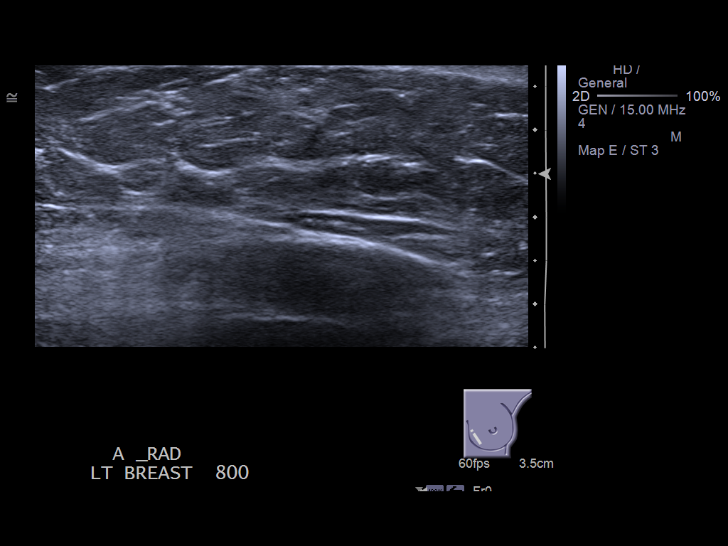
[im 14/24]
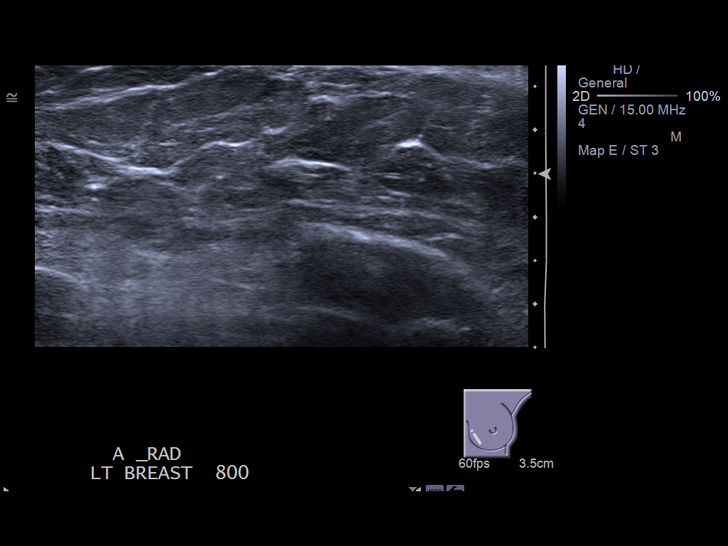
[im 16/24]
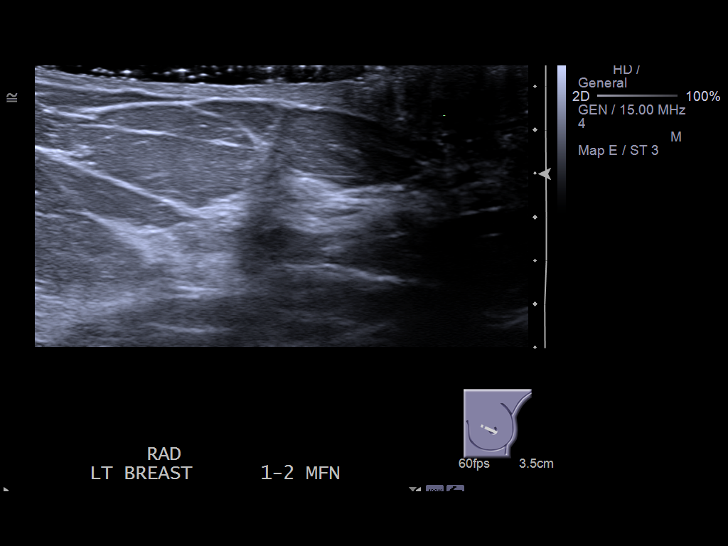
[im 18/24]
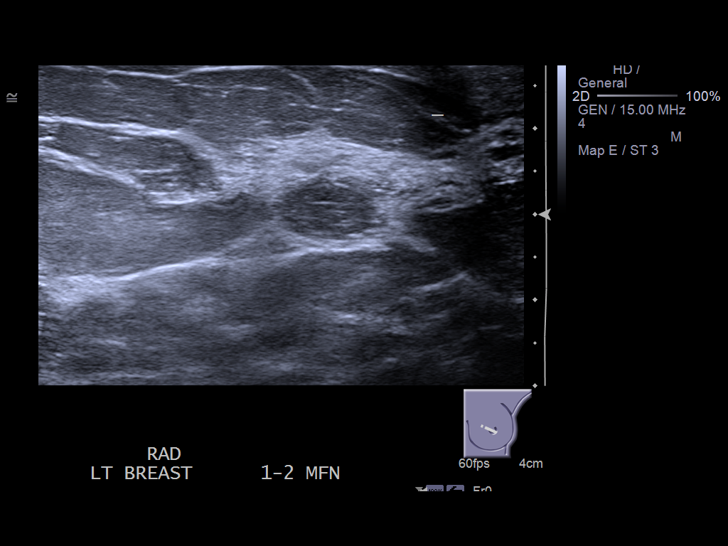
[im 20/24]
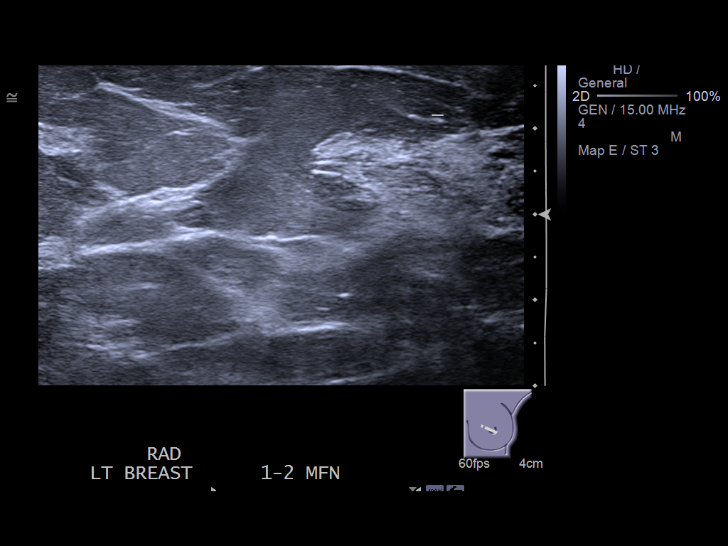
[im 22/24]
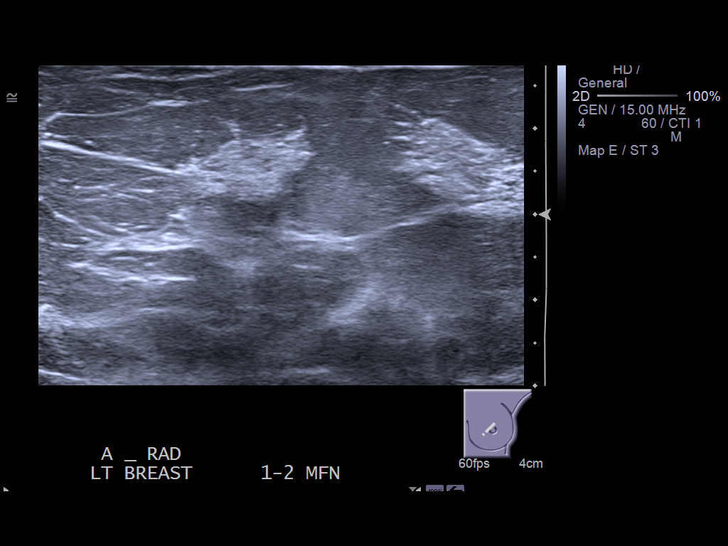
[im 24/24]
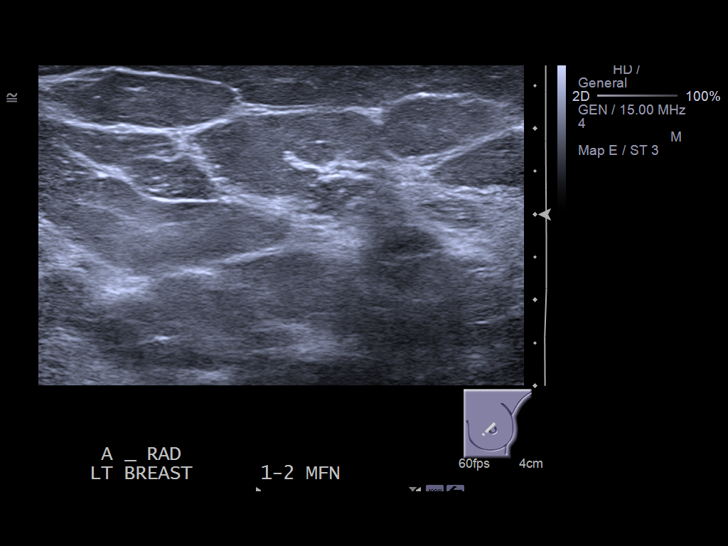

[13 of 24 positions shown; findings below may reference images not displayed]

FINDINGS: There is scattered fibroglandular tissue. No suspicious masses or
calcifications are identified. No areas of architectural distortion. Markers
were placed along the inferior medial left breast at [DATE] and [DATE] as well
as the anterior periareolar aspect of the left breast at approximately
10-11:00 secondary to reported palpable abnormalities in these locations.
Spot compression magnification views were performed. No mass or focal
asymmetry is identified.

Real-time ultrasound was performed of the left breast at the site of
reported areas of palpable abnormality including the regions of 7 and 8
o'clock as well as in the periareolar superior medial left breast at
approximately 10-11:00. No mass or suspicious shadowing was seen.
IMPRESSION: 1.     BI-RADS: Category 1 Negative.
2.     There is no mammographic or sonographic evidence of malignancy.
Recommend further evaluation and management of the patient's reported
palpable abnormality be based on clinical grounds. If one or more regions of
palpable abnormality remain clinically suspicious, surgical consultation
would be recommended.
3.     Recommend continued annual screening mammography.

BREAST COMPOSITION: The breast composition is SCATTERED FIBROGLANDULAR
TISSUE (glandular tissue is 25-50%).

## 2014-09-19 ENCOUNTER — Ambulatory Visit: Payer: Self-pay | Admitting: Cardiology

## 2014-10-29 ENCOUNTER — Ambulatory Visit: Payer: Self-pay | Admitting: Cardiology

## 2014-11-28 ENCOUNTER — Encounter: Payer: Self-pay | Admitting: Cardiology

## 2014-11-28 ENCOUNTER — Ambulatory Visit (INDEPENDENT_AMBULATORY_CARE_PROVIDER_SITE_OTHER): Payer: Self-pay | Admitting: Cardiology

## 2014-11-28 ENCOUNTER — Encounter (INDEPENDENT_AMBULATORY_CARE_PROVIDER_SITE_OTHER): Payer: Self-pay

## 2014-11-28 VITALS — BP 124/74 | HR 87 | Ht 67.0 in | Wt 187.8 lb

## 2014-11-28 DIAGNOSIS — I10 Essential (primary) hypertension: Secondary | ICD-10-CM

## 2014-11-28 DIAGNOSIS — R002 Palpitations: Secondary | ICD-10-CM

## 2014-11-28 NOTE — Progress Notes (Signed)
Cardiology Office Note  Date: 11/28/2014   ID: Rich Number Philip Eckersley, DOB [birth year], MRN [id number]  PCP: No primary care provider on file.  Primary Cardiologist: Nona Dell, MD   Chief Complaint  Patient presents with  . Follow-up palpitations  . Hypertension    History of Present Illness: Tracey Mcdaniel is a 49 y.o. female last seen in December 2015. She presents for a routine follow-up visit. She tells me that several months ago she strained her back while she was running, stepped in an indentation and twisted her back. She is getting back to baseline now, has gained weight since she had to cut back her exercise. She plans to start back by walking first.  Otherwise palpitations have been stable. She continues on Toprol-XL. Follow-up ECG today shows sinus rhythm with nonspecific T-wave changes and borderline short PR interval.   Past Medical History  Diagnosis Date  . Essential hypertension, benign   . Anxiety   . Allergic rhinitis   . Endometriosis   . Ovarian cyst   . Eczema     Current Outpatient Prescriptions  Medication Sig Dispense Refill  . doxycycline (ORACEA) 40 MG capsule Take 40 mg by mouth every morning.    Marland Kitchen levonorgestrel-ethinyl estradiol (LYBREL,AMETHYST) 90-20 MCG tablet Take 1 tablet by mouth daily.    . metoprolol succinate (TOPROL-XL) 100 MG 24 hr tablet TAKE 1 TABLET (100 MG TOTAL) BY MOUTH DAILY. TAKE WITH OR IMMEDIATELY FOLLOWING A MEAL. 90 tablet 3  . Multiple Vitamins-Minerals (NICAZEL PO) Take 2 tablets by mouth daily.    . pimecrolimus (ELIDEL) 1 % cream Apply topically 2 (two) times daily. 60 g 3  . Salicylic Acid 2 % CREA Apply topically.    . triamterene-hydrochlorothiazide (MAXZIDE-25) 37.5-25 MG per tablet TAKE 1 TABLET BY MOUTH DAILY. 90 tablet 3  . fluticasone (FLONASE) 50 MCG/ACT nasal spray Place 1 spray into the nose daily. 16 g 3   No current facility-administered medications for this visit.    Allergies:   Review of patient's allergies indicates no known allergies.   Social History: The patient  reports that she has never smoked. She does not have any smokeless tobacco history on file. She reports that she does not drink alcohol or use illicit drugs.   ROS:  Please see the history of present illness. Otherwise, complete review of systems is positive for none.  All other systems are reviewed and negative.   Physical Exam: VS:  BP 124/74 mmHg  Pulse 87  Ht  (1.702 m)  Wt 187 lb 12.8 oz (85.186 kg)  BMI 29.41 kg/m2  SpO2 98%, BMI Body mass index is 29.41 kg/(m^2).  Wt Readings from Last 3 Encounters:  11/28/14 187 lb 12.8 oz (85.186 kg)  03/13/14 178 lb 6.4 oz (80.922 kg)  09/03/13 190 lb (86.183 kg)     Patient appears comfortable at rest.  HEENT: Conjunctiva and lids normal, oropharynx clear.  Neck: Supple, no elevated JVP or carotid bruits, no thyromegaly.  Lungs: Clear to auscultation, nonlabored breathing at rest.  Cardiac: Regular rate and rhythm, no S3 or significant systolic murmur, no pericardial rub.  Abdomen: Soft, nontender, bowel sounds present.  Extremities: No pitting edema, distal pulses 2+.    ECG: ECG is ordered today.  Other Studies Reviewed Today:  GXT done in January 2015 was negative for ischemia with Duke treadmill score of 10 consistent with low risk. She had good functional capacity, no arrhythmias were documented. Cardiac monitor  showed sinus rhythm and probable sinus tachycardia (heart rate 160s while playing basketball), occasional to at times frequent PVCs also noted. No definitive evidence of PSVT other than sinus.  Assessment and Plan:  1. Palpitations, well-controlled on current regimen. I encouraged her to get back gradually to her regular exercise plan.  2. Essential hypertension, reasonable blood pressure control today.  Current medicines were reviewed with the patient today.   Orders Placed This Encounter  Procedures  . EKG  12-Lead    Disposition: FU with me in 6 months.   Signed, Jonelle Sidle, MD, [facility] 11/28/2014 9:11 AM    [facility] at [facility] [address] [place], [place], [place] [address] Phone: [phone]; Fax: [phone]

## 2014-11-28 NOTE — Patient Instructions (Signed)
Your physician wants you to follow-up in: 6 months with Dr.McDowell You will receive a reminder letter in the mail two months in advance. If you don't receive a letter, please call our office to schedule the follow-up appointment.     Your physician recommends that you continue on your current medications as directed. Please refer to the Current Medication list given to you today.     Thank you for choosing [facility] !        

## 2015-06-05 ENCOUNTER — Encounter: Payer: Self-pay | Admitting: Cardiology

## 2015-06-05 ENCOUNTER — Ambulatory Visit (INDEPENDENT_AMBULATORY_CARE_PROVIDER_SITE_OTHER): Payer: BLUE CROSS/BLUE SHIELD | Admitting: Cardiology

## 2015-06-05 VITALS — BP 144/88 | HR 87 | Ht 67.0 in | Wt 182.0 lb

## 2015-06-05 DIAGNOSIS — I10 Essential (primary) hypertension: Secondary | ICD-10-CM

## 2015-06-05 DIAGNOSIS — R002 Palpitations: Secondary | ICD-10-CM

## 2015-06-05 MED ORDER — TRIAMTERENE-HCTZ 37.5-25 MG PO TABS: 1.0000 | ORAL_TABLET | Freq: Every day | ORAL | Status: DC

## 2015-06-05 NOTE — Progress Notes (Signed)
Cardiology Office Note  Date: 06/05/2015   ID: Tracey Mcdaniel, DOB 08-Dec-1965, MRN [id number]  PCP: PROVIDER NOT IN SYSTEM  Primary Cardiologist: Nona Dell, MD   Chief Complaint  Patient presents with  . Follow-up palpitations    History of Present Illness: Tracey Mcdaniel is a 50 y.o. female last seen in August 2016. She presents for a routine follow-up visit. Still works full-time at [employer]. She has been faithful with her exercise regimen, now doing water aerobics instead of jogging. She exercises at lunch time for an hour a day. She has almost lost 10 pounds compared to last visit.  Blood pressure was mildly elevated today when she came in. She reports compliance with her medications. She is currently on Maxide and Toprol-XL.  In terms of palpitations she has had no major breakthrough episodes. Sometimes if she wakes up in a start at nighttime her heart rate is up but it comes down very quickly. Otherwise she has no spontaneous sense of rapid heart rate or palpitations.  Past Medical History  Diagnosis Date  . Essential hypertension, benign   . Anxiety   . Allergic rhinitis   . Endometriosis   . Ovarian cyst   . Eczema     Current Outpatient Prescriptions  Medication Sig Dispense Refill  . doxycycline (ORACEA) 40 MG capsule Take 40 mg by mouth every morning.    Marland Kitchen levonorgestrel-ethinyl estradiol (LYBREL,AMETHYST) 90-20 MCG tablet Take 1 tablet by mouth daily.    . metoprolol succinate (TOPROL-XL) 100 MG 24 hr tablet TAKE 1 TABLET (100 MG TOTAL) BY MOUTH DAILY. TAKE WITH OR IMMEDIATELY FOLLOWING A MEAL. 90 tablet 3  . Multiple Vitamins-Minerals (NICAZEL PO) Take 2 tablets by mouth daily.    Marland Kitchen triamterene-hydrochlorothiazide (MAXZIDE-25) 37.5-25 MG tablet Take 1 tablet by mouth daily. 90 tablet 3  . fluticasone (FLONASE) 50 MCG/ACT nasal spray Place 1 spray into the nose daily. 16 g 3  . Salicylic Acid 2 % CREA Apply topically.     No current  facility-administered medications for this visit.   Allergies:  Review of patient's allergies indicates no known allergies.   Social History: The patient  reports that she has never smoked. She does not have any smokeless tobacco history on file. She reports that she does not drink alcohol or use illicit drugs.   ROS:  Please see the history of present illness. Otherwise, complete review of systems is positive for none.  All other systems are reviewed and negative.   Physical Exam: VS:  BP 144/88 mmHg  Pulse 87  Ht  (1.702 m)  Wt 182 lb (82.555 kg)  BMI 28.50 kg/m2  SpO2 99%, BMI Body mass index is 28.5 kg/(m^2).  Wt Readings from Last 3 Encounters:  06/05/15 182 lb (82.555 kg)  11/28/14 187 lb 12.8 oz (85.186 kg)  03/13/14 178 lb 6.4 oz (80.922 kg)    Patient appears comfortable at rest.  HEENT: Conjunctiva and lids normal, oropharynx clear.  Neck: Supple, no elevated JVP or carotid bruits, no thyromegaly.  Lungs: Clear to auscultation, nonlabored breathing at rest.  Cardiac: Regular rate and rhythm, no S3 or significant systolic murmur, no pericardial rub.  Abdomen: Soft, nontender, bowel sounds present.  Extremities: No pitting edema, distal pulses 2+.  ECG: I personally reviewed the prior tracing from 11/28/2014 which showed sinus rhythm with borderline short PR interval and nonspecific T-wave changes.  Other Studies Reviewed Today:  GXT done in January 2015 was negative  for ischemia with Duke treadmill score of 10 consistent with low risk. She had good functional capacity, no arrhythmias were documented. Cardiac monitor showed sinus rhythm and probable sinus tachycardia (heart rate 160s while playing basketball), occasional to at times frequent PVCs also noted. No definitive evidence of PSVT other than sinus.  Assessment and Plan:  1. History of palpitations, no significant progression on beta blocker. Encouraged regular exercise.  2. Essential hypertension.  Continue current exercise plan. She would like to lose 10 more pounds. No change in current regimen including Maxide and Toprol-XL. Keep follow-up with PCP.  Current medicines were reviewed with the patient today.  Disposition: FU with me in 6 months.   Signed, Jonelle Sidle, MD, [facility] 06/05/2015 4:43 PM    [facility] at [facility] [address], [place], [place] [address] Phone: [phone]; Fax: [phone]

## 2015-06-05 NOTE — Patient Instructions (Signed)
Your physician wants you to follow-up in: 6 months with Dr.McDowell You will receive a reminder letter in the mail two months in advance. If you don't receive a letter, please call our office to schedule the follow-up appointment.     Your physician recommends that you continue on your current medications as directed. Please refer to the Current Medication list given to you today.     Thank you for choosing [facility] !        

## 2015-08-15 ENCOUNTER — Other Ambulatory Visit: Payer: Self-pay | Admitting: Cardiology

## 2015-12-01 ENCOUNTER — Ambulatory Visit: Payer: BLUE CROSS/BLUE SHIELD | Admitting: Cardiology

## 2015-12-08 ENCOUNTER — Encounter (HOSPITAL_COMMUNITY): Payer: Self-pay | Admitting: Obstetrics and Gynecology

## 2015-12-08 NOTE — Progress Notes (Signed)
Cardiology Office Note  Date: 12/09/2015   ID: Tracey Mcdaniel, DOB 09/24/1965, MRN [id number]  PCP: PROVIDER NOT IN SYSTEM  Primary Cardiologist: Nona DellSamuel McDowell, MD   Chief Complaint  Patient presents with  . Follow-up palpitations    History of Present Illness: Tracey Mcdaniel is a 50 y.o. female last seen in February. She presents for a routine follow-up visit. Reports only occasional palpitations with emotional stress, otherwise no prolonged symptoms on beta blocker therapy. She continues to work full time at [employer].  She has been concerned about her weight, trying to diet and exercise. It does sound like she is doing some positive things, cycling and jogging with her kids. We talked about mixing up her exercise routine somewhat, also perhaps considering the [employer] style diet. She does have a nutritionist that she could see, and we might make a referral ultimately if she is not successful. She would like to lose at least another 10 pounds.  I reviewed her ECG today which shows sinus rhythm with borderline short PR interval.  Past Medical History:  Diagnosis Date  . Allergic rhinitis   . Anxiety   . Eczema   . Endometriosis   . Essential hypertension, benign   . Ovarian cyst     Current Outpatient Prescriptions  Medication Sig Dispense Refill  . doxycycline (ORACEA) 40 MG capsule Take 40 mg by mouth every morning.    Marland Kitchen. levonorgestrel-ethinyl estradiol (LYBREL,AMETHYST) 90-20 MCG tablet Take 1 tablet by mouth daily.    . metoprolol succinate (TOPROL-XL) 100 MG 24 hr tablet TAKE 1 TABLET (100 MG TOTAL) BY MOUTH DAILY. TAKE WITH OR IMMEDIATELY FOLLOWING A MEAL. 90 tablet 3  . Multiple Vitamins-Minerals (NICAZEL PO) Take 2 tablets by mouth daily.    . Salicylic Acid 2 % CREA Apply topically.    . triamterene-hydrochlorothiazide (MAXZIDE-25) 37.5-25 MG tablet Take 1 tablet by mouth daily. 90 tablet 3   No current facility-administered medications for  this visit.    Allergies:  Review of patient's allergies indicates no known allergies.   Social History: The patient  reports that she has never smoked. She has never used smokeless tobacco. She reports that she does not drink alcohol or use drugs.   ROS:  Please see the history of present illness. Otherwise, complete review of systems is positive for none.  All other systems are reviewed and negative.   Physical Exam: VS:  BP 126/86   Pulse 89   Ht 5\' 7"  (1.702 m)   Wt 187 lb (84.8 kg)   SpO2 98%   BMI 29.29 kg/m , BMI Body mass index is 29.29 kg/m.  Wt Readings from Last 3 Encounters:  12/09/15 187 lb (84.8 kg)  06/05/15 182 lb (82.6 kg)  11/28/14 187 lb 12.8 oz (85.2 kg)    Patient appears comfortable at rest.  HEENT: Conjunctiva and lids normal, oropharynx clear.  Neck: Supple, no elevated JVP or carotid bruits, no thyromegaly.  Lungs: Clear to auscultation, nonlabored breathing at rest.  Cardiac: Regular rate and rhythm, no S3 or significant systolic murmur, no pericardial rub.  Abdomen: Soft, nontender, bowel sounds present.  Extremities: No pitting edema, distal pulses 2+.  ECG: I personally reviewed the tracing from 11/28/2014 which showed sinus rhythm with borderline short PR interval and nonspecific T-wave changes.  Other Studies Reviewed Today:  GXT January 2015: Negative for ischemia with Duke treadmill score of 10 consistent with low risk. She had good functional capacity, no  arrhythmias were documented.   Assessment and Plan:  1. History of palpitations, symptoms are stable on beta blocker therapy. I reviewed her ECG. Recommended continued exercise and observation.  2. Patient concerned about achieving weight loss. We did discuss exercise and diet plan today. May need referral to a nutritionist ultimately.  3. Essential hypertension. Continue current regimen.  Current medicines were reviewed with the patient today.   Orders Placed This Encounter    Procedures  . EKG 12-Lead    Disposition: Follow-up with me in 6 months.  Signed, Jonelle Sidle, MD, [facility] 12/09/2015 9:17 AM    [facility] at [facility] [address], [place], [place] [address] Phone: [phone]; Fax: [phone]

## 2015-12-09 ENCOUNTER — Ambulatory Visit (INDEPENDENT_AMBULATORY_CARE_PROVIDER_SITE_OTHER): Payer: BLUE CROSS/BLUE SHIELD | Admitting: Cardiology

## 2015-12-09 ENCOUNTER — Encounter: Payer: Self-pay | Admitting: Cardiology

## 2015-12-09 VITALS — BP 126/86 | HR 89 | Ht 67.0 in | Wt 187.0 lb

## 2015-12-09 DIAGNOSIS — I10 Essential (primary) hypertension: Secondary | ICD-10-CM | POA: Diagnosis not present

## 2015-12-09 DIAGNOSIS — R002 Palpitations: Secondary | ICD-10-CM

## 2015-12-09 NOTE — Patient Instructions (Signed)
Your physician recommends that you continue on your current medications as directed. Please refer to the Current Medication list given to you today.   Your physician wants you to follow-up in: 6 months.  You will receive a reminder letter in the mail two months in advance. If you don't receive a letter, please call our office to schedule the follow-up appointment.  Thanks for choosing [facility]!!!    

## 2016-06-15 NOTE — Progress Notes (Signed)
Cardiology Office Note  Date: 06/16/2016   ID: Tracey Mcdaniel, DOB 07/07/1965, MRN [id number]  PCP: PROVIDER NOT IN SYSTEM  Primary Cardiologist: Nona DellSamuel Alic Hilburn, MD   Chief Complaint  Patient presents with  . Palpitations    History of Present Illness: Tracey Mcdaniel is a 51 y.o. female last seen in August 2017. She presents for a routine follow-up visit. Reports occasional "skips and blips," but no prolonged palpitations, no lightheadedness or syncope.  She continues to work full time at [employer]. Her schedule allows regular time for exercise usually around the lunch hour. She walks with a friend and also plans to start a weights class. She has lost about 10 pounds by her home scales by cutting back on carbohydrates.  I reviewed her medications which are outlined below. She continues on Toprol-XL 100 mg daily. We have not had to modify the dose. Blood pressure today is adequately controlled. She is also on Maxide.  Past Medical History:  Diagnosis Date  . Allergic rhinitis   . Anxiety   . Eczema   . Endometriosis   . Essential hypertension, benign   . Ovarian cyst     History reviewed. No pertinent surgical history.  Current Outpatient Prescriptions  Medication Sig Dispense Refill  . doxycycline (ORACEA) 40 MG capsule Take 40 mg by mouth every morning.    Marland Kitchen. levonorgestrel-ethinyl estradiol (LYBREL,AMETHYST) 90-20 MCG tablet Take 1 tablet by mouth daily.    . metoprolol succinate (TOPROL-XL) 100 MG 24 hr tablet TAKE 1 TABLET (100 MG TOTAL) BY MOUTH DAILY. TAKE WITH OR IMMEDIATELY FOLLOWING A MEAL. 90 tablet 3  . Multiple Vitamins-Minerals (NICAZEL PO) Take 2 tablets by mouth daily.    . Salicylic Acid 2 % CREA Apply topically.    . triamterene-hydrochlorothiazide (MAXZIDE-25) 37.5-25 MG tablet Take 1 tablet by mouth daily. 90 tablet 3   No current facility-administered medications for this visit.    Allergies:  Patient has no known allergies.   Social  History: The patient  reports that she has never smoked. She has never used smokeless tobacco. She reports that she does not drink alcohol or use drugs.   ROS:  Please see the history of present illness. Otherwise, complete review of systems is positive for none.  All other systems are reviewed and negative.   Physical Exam: VS:  BP 118/68   Pulse 96   Ht 5\' 7"  (1.702 m)   Wt 181 lb (82.1 kg)   SpO2 98%   BMI 28.35 kg/m , BMI Body mass index is 28.35 kg/m.  Wt Readings from Last 3 Encounters:  06/16/16 181 lb (82.1 kg)  12/09/15 187 lb (84.8 kg)  06/05/15 182 lb (82.6 kg)    Patient appears comfortable at rest.  HEENT: Conjunctiva and lids normal, oropharynx clear.  Neck: Supple, no elevated JVP or carotid bruits, no thyromegaly.  Lungs: Clear to auscultation, nonlabored breathing at rest.  Cardiac: Regular rate and rhythm, no S3 or significant systolic murmur, no pericardial rub.  Abdomen: Soft, nontender, bowel sounds present.  Extremities: No pitting edema, distal pulses 2+.  ECG: I personally reviewed the tracing from 12/09/2015 which showed sinus rhythm with borderline short PR interval.  Other Studies Reviewed Today:  GXT January 2015: Negative for ischemia with Duke treadmill score of 10 consistent with low risk. She had good functional capacity, no arrhythmias were documented.   Assessment and Plan:  1. History of palpitations, adequately controlled on current dose of Toprol-XL.  No progressive symptoms, lightheadedness, or syncope. Recommended continued exercise plan and observation.  2. Essential hypertension, blood pressure is adequately controlled today. She is also on Maxide. He limits salt in her diet.  Current medicines were reviewed with the patient today.  Disposition: Follow-up in 6 months.  Signed, Jonelle Sidle, MD, [facility] 06/16/2016 8:17 AM    [facility] at [facility] [address], [place], [place] [address] Phone:  [phone]; Fax: [phone]

## 2016-06-16 ENCOUNTER — Ambulatory Visit (INDEPENDENT_AMBULATORY_CARE_PROVIDER_SITE_OTHER): Payer: BLUE CROSS/BLUE SHIELD | Admitting: Cardiology

## 2016-06-16 ENCOUNTER — Encounter: Payer: Self-pay | Admitting: Cardiology

## 2016-06-16 VITALS — BP 118/68 | HR 96 | Ht 67.0 in | Wt 181.0 lb

## 2016-06-16 DIAGNOSIS — I10 Essential (primary) hypertension: Secondary | ICD-10-CM

## 2016-06-16 DIAGNOSIS — R002 Palpitations: Secondary | ICD-10-CM | POA: Diagnosis not present

## 2016-06-16 NOTE — Patient Instructions (Signed)
Your physician wants you to follow-up in: 6 months with Dr McDowell You will receive a reminder letter in the mail two months in advance. If you don't receive a letter, please call our office to schedule the follow-up appointment.     Your physician recommends that you continue on your current medications as directed. Please refer to the Current Medication list given to you today.    If you need a refill on your cardiac medications before your next appointment, please call your pharmacy.     Thank you for choosing [facility] !        

## 2016-08-08 ENCOUNTER — Other Ambulatory Visit: Payer: Self-pay | Admitting: Cardiology

## 2016-12-14 ENCOUNTER — Other Ambulatory Visit: Payer: Self-pay | Admitting: Medical

## 2016-12-20 NOTE — Progress Notes (Signed)
Cardiology Office Note  Date: 12/21/2016   ID: Tracey Mcdaniel, DOB 06/12/1965, MRN [id number]  PCP: System, Provider Not In  Primary Cardiologist: Tracey DellSamuel Brok Stocking, MD   Chief Complaint  Patient presents with  . Palpitations    History of Present Illness: Tracey Mcdaniel is a 51 y.o. female last seen in March. She presents for a routine follow-up visit. Continues to work at [employer] full-time. She still is able to exercise nearly everyday of the week, does this after work. She walks and intermittently slow jogs while her children rde bikes. She does up to 3 miles at a time. She has lost weight since last encounter, feels very good about this. She states ideally she would like to be in the 150s.  We reviewed her medications. She is doing well on Toprol-XL with no significant palpitations.  I personally reviewed her ECG today which shows sinus rhythm with nonspecific T-wave changes.  Past Medical History:  Diagnosis Date  . Allergic rhinitis   . Anxiety   . Eczema   . Endometriosis   . Essential hypertension, benign   . Ovarian cyst     History reviewed. No pertinent surgical history.  Current Outpatient Prescriptions  Medication Sig Dispense Refill  . doxycycline (ORACEA) 40 MG capsule Take 40 mg by mouth every morning.    Marland Kitchen. levonorgestrel-ethinyl estradiol (LYBREL,AMETHYST) 90-20 MCG tablet Take 1 tablet by mouth daily.    . metoprolol succinate (TOPROL-XL) 100 MG 24 hr tablet TAKE 1 TABLET (100 MG TOTAL) BY MOUTH DAILY. TAKE WITH OR IMMEDIATELY FOLLOWING A MEAL. 90 tablet 3  . Multiple Vitamins-Minerals (NICAZEL PO) Take 2 tablets by mouth daily.    . Salicylic Acid 2 % CREA Apply topically.    . triamterene-hydrochlorothiazide (MAXZIDE-25) 37.5-25 MG tablet TAKE 1 TABLET BY MOUTH DAILY. 90 tablet 3   No current facility-administered medications for this visit.    Allergies:  Patient has no known allergies.   Social History: The patient  reports that  she has never smoked. She has never used smokeless tobacco. She reports that she does not drink alcohol or use drugs.   ROS:  Please see the history of present illness. Otherwise, complete review of systems is positive for none.  All other systems are reviewed and negative.   Physical Exam: VS:  BP 130/72   Pulse 69   Wt 163 lb (73.9 kg)   SpO2 96%   BMI 25.53 kg/m , BMI Body mass index is 25.53 kg/m.  Wt Readings from Last 3 Encounters:  12/21/16 163 lb (73.9 kg)  06/16/16 181 lb (82.1 kg)  12/09/15 187 lb (84.8 kg)    Gen.: Patient appears couple at rest. HEENT: Conjunctiva and lids normal, oropharynx clear. Neck: Supple, no elevated JVP or carotid bruits, no thyromegaly. Lungs: Clear to auscultation, nonlabored breathing at rest. Cardiac: Regular rate and rhythm, no S3 or significant systolic murmur, no pericardial rub. Abdomen: Soft, nontender, bowel sounds present. Extremities: No pitting edema, distal pulses 2+.  ECG: I personally reviewed the tracing from 12/09/2015 which showed sinus rhythm with borderline short PR interval.  Other Studies Reviewed Today:  GXT January 2015: Negative for ischemia with Duke treadmill score of 10 consistent with low risk. She had good functional capacity, no arrhythmias were documented.   Assessment and Plan:  1. History of palpitations, no substantial recurrences on Toprol-XL. She is exercising regularly and feels well. ECG stable.  2. Essential hypertension, continues on Maxide addition to  Toprol-XL. She is following a low sodium diet.  Current medicines were reviewed with the patient today.   Orders Placed This Encounter  Procedures  . EKG 12-Lead    Disposition: Follow-up in 6 months.  Signed, Jonelle Sidle, MD, [facility] 12/21/2016 10:08 AM    [facility] at [facility] [address] [place], [place] [address] Phone: [phone]; Fax: [phone]

## 2016-12-21 ENCOUNTER — Encounter: Payer: Self-pay | Admitting: Cardiology

## 2016-12-21 ENCOUNTER — Other Ambulatory Visit: Payer: Self-pay | Admitting: Medical

## 2016-12-21 ENCOUNTER — Ambulatory Visit (INDEPENDENT_AMBULATORY_CARE_PROVIDER_SITE_OTHER): Payer: BLUE CROSS/BLUE SHIELD | Admitting: Cardiology

## 2016-12-21 VITALS — BP 130/72 | HR 69 | Wt 163.0 lb

## 2016-12-21 DIAGNOSIS — I10 Essential (primary) hypertension: Secondary | ICD-10-CM

## 2016-12-21 DIAGNOSIS — R002 Palpitations: Secondary | ICD-10-CM | POA: Diagnosis not present

## 2016-12-21 DIAGNOSIS — J301 Allergic rhinitis due to pollen: Secondary | ICD-10-CM

## 2016-12-21 MED ORDER — MOMETASONE FUROATE 50 MCG/ACT NA SUSP: 2.0000 | Freq: Every day | NASAL | 5 refills | Status: AC

## 2016-12-21 NOTE — Patient Instructions (Signed)

## 2017-02-20 ENCOUNTER — Other Ambulatory Visit: Payer: Self-pay

## 2017-02-20 DIAGNOSIS — L659 Nonscarring hair loss, unspecified: Secondary | ICD-10-CM

## 2017-02-21 LAB — TESTOSTERONE,FREE AND TOTAL
TESTOSTERONE FREE: 0.3 pg/mL (ref 0.0–4.2)
Testosterone: <3 ng/dL — ABNORMAL LOW (ref 8–48)

## 2017-02-21 LAB — THYROID PANEL WITH TSH
Free Thyroxine Index: 1.8 (ref 1.2–4.9)
T3 UPTAKE RATIO: 24 % (ref 24–39)
T4 TOTAL: 7.6 ug/dL (ref 4.5–12.0)
TSH: 1.61 u[IU]/mL (ref 0.450–4.500)

## 2017-03-08 ENCOUNTER — Telehealth: Payer: Self-pay

## 2017-03-08 NOTE — Telephone Encounter (Signed)
pt inquiring whether she is a PCP; H. Ratcliffe, B. Day and myselft reviewed Medicat and pt is not established PCP; informed pt  and offered Physician referral line and pt declined; not accepting new PCP at this time; pt verb u/o

## 2017-06-23 NOTE — Telephone Encounter (Signed)
Called to follow up on patient , she did follow up with a cardiologist, however has not set up with a PCP yet.

## 2017-08-04 ENCOUNTER — Other Ambulatory Visit: Payer: Self-pay | Admitting: Cardiology

## 2017-08-10 ENCOUNTER — Ambulatory Visit: Payer: BLUE CROSS/BLUE SHIELD | Admitting: Cardiology

## 2017-08-25 ENCOUNTER — Other Ambulatory Visit: Payer: Self-pay | Admitting: Cardiology

## 2017-09-12 NOTE — Progress Notes (Signed)
Cardiology Office Note  Date: 09/15/2017   ID: Rich Numberheryl Anne Gretchen PortelaSchauer Crabb, DOB 01/14/1966, MRN [id number]  PCP: Patient, No Pcp Per  Primary Cardiologist: Nona DellSamuel McDowell, MD   Chief Complaint  Patient presents with  . Palpitations    History of Present Illness: Tracey RoughCheryl Anne Schauer Crabb is a 52 y.o. female last seen in September 2018.  She presents for a routine follow-up visit.  Still reports occasional palpitations particularly if she gets upset or under stress, but this has not progressed in terms of intensity or frequency and she has had no chest pain or syncope.  She continues to work at [employer], exercises by doing water classes and also Yoga.  She has had no change in stamina.  I reviewed her medications which are outlined below.  She remains on Toprol-XL and Maxide as before.  Last ECG was in September 2018 as outlined below.  Past Medical History:  Diagnosis Date  . Allergic rhinitis   . Anxiety   . Eczema   . Endometriosis   . Essential hypertension, benign   . Ovarian cyst     History reviewed. No pertinent surgical history.  Current Outpatient Medications  Medication Sig Dispense Refill  . doxycycline (ORACEA) 40 MG capsule Take 40 mg by mouth every morning.    Marland Kitchen. levonorgestrel-ethinyl estradiol (LYBREL,AMETHYST) 90-20 MCG tablet Take 1 tablet by mouth daily.    . metoprolol succinate (TOPROL-XL) 100 MG 24 hr tablet TAKE 1 TABLET BY MOUTH DAILY. TAKE WITH OR IMMEDIATELY FOLLOWING A MEAL. 90 tablet 3  . mometasone (NASONEX) 50 MCG/ACT nasal spray Place 2 sprays into the nose daily. 17 g 5  . Multiple Vitamins-Minerals (NICAZEL PO) Take 2 tablets by mouth daily.    . Salicylic Acid 2 % CREA Apply topically.    . triamterene-hydrochlorothiazide (MAXZIDE-25) 37.5-25 MG tablet Take 1 tablet by mouth daily. 90 tablet 3   No current facility-administered medications for this visit.    Allergies:  Patient has no known allergies.   Social History: The patient   reports that she has never smoked. She has never used smokeless tobacco. She reports that she does not drink alcohol or use drugs.   ROS:  Please see the history of present illness. Otherwise, complete review of systems is positive for none.  All other systems are reviewed and negative.   Physical Exam: VS:  BP 140/90   Pulse 88   Wt 175 lb (79.4 kg)   SpO2 98%   BMI 27.41 kg/m , BMI Body mass index is 27.41 kg/m.  Wt Readings from Last 3 Encounters:  09/15/17 175 lb (79.4 kg)  12/21/16 163 lb (73.9 kg)  06/16/16 181 lb (82.1 kg)    General: Patient appears comfortable at rest. HEENT: Conjunctiva and lids normal, oropharynx clear. Neck: Supple, no elevated JVP or carotid bruits, no thyromegaly. Lungs: Clear to auscultation, nonlabored breathing at rest. Cardiac: Regular rate and rhythm, no S3 or significant systolic murmur, no pericardial rub. Abdomen: Soft, nontender, bowel sounds present. Extremities: No pitting edema, distal pulses 2+. Skin: Warm and dry. Musculoskeletal: No kyphosis. Neuropsychiatric: Alert and oriented x3, affect grossly appropriate.  ECG: I personally reviewed the tracing from 12/21/2016 which showed sinus rhythm with nonspecific T wave changes.  Recent Labwork: 02/20/2017: TSH 1.610   Other Studies Reviewed Today:  GXT January 2015: Negative for ischemia with Duke treadmill score of 10 consistent with low risk. She had good functional capacity, no arrhythmias were documented.   Assessment  and Plan:  1.  Palpitations, no progression in symptoms, no syncope.  She is tolerating Toprol-XL and continues to exercise as well.  2.  Essential hypertension, blood pressure is up some today.  She reports a stressful event at work prior to coming in, otherwise has had better control.  No changes made to current regimen.  Refill provided for Maxide.  Current medicines were reviewed with the patient today.  Disposition: Follow-up in 6 months.  Signed, Jonelle Sidle, MD, [facility] 09/15/2017 2:46 PM    [facility] at [facility] [address], [place], [place] [address] Phone: [phone]; Fax: [phone]

## 2017-09-15 ENCOUNTER — Ambulatory Visit: Payer: BLUE CROSS/BLUE SHIELD | Admitting: Cardiology

## 2017-09-15 ENCOUNTER — Encounter: Payer: Self-pay | Admitting: Cardiology

## 2017-09-15 VITALS — BP 140/90 | HR 88 | Wt 175.0 lb

## 2017-09-15 DIAGNOSIS — I10 Essential (primary) hypertension: Secondary | ICD-10-CM

## 2017-09-15 DIAGNOSIS — R002 Palpitations: Secondary | ICD-10-CM

## 2017-09-15 MED ORDER — TRIAMTERENE-HCTZ 37.5-25 MG PO TABS: 1.0000 | ORAL_TABLET | Freq: Every day | ORAL | 3 refills | Status: DC

## 2017-09-15 NOTE — Patient Instructions (Signed)

## 2017-10-09 ENCOUNTER — Encounter: Payer: Self-pay | Admitting: Cardiology

## 2017-10-09 ENCOUNTER — Telehealth: Payer: Self-pay

## 2017-10-09 MED ORDER — METOPROLOL SUCCINATE ER 25 MG PO TB24: 25.0000 mg | ORAL_TABLET | Freq: Every day | ORAL | 3 refills | Status: DC

## 2017-10-09 NOTE — Telephone Encounter (Signed)
Will send pt email to inform her of medication adjustment.

## 2017-10-18 ENCOUNTER — Encounter: Payer: Self-pay | Admitting: Cardiology

## 2017-10-19 NOTE — Telephone Encounter (Signed)
Readings noted.  It looks like the trend is improving.  Wouldl continue with current medication and plan for regular exercise.

## 2017-10-24 ENCOUNTER — Encounter: Payer: Self-pay | Admitting: Cardiology

## 2017-10-31 ENCOUNTER — Encounter: Payer: Self-pay | Admitting: Cardiology

## 2018-02-12 ENCOUNTER — Other Ambulatory Visit: Payer: Self-pay

## 2018-02-12 DIAGNOSIS — L659 Nonscarring hair loss, unspecified: Secondary | ICD-10-CM

## 2018-02-12 DIAGNOSIS — E559 Vitamin D deficiency, unspecified: Secondary | ICD-10-CM

## 2018-02-12 DIAGNOSIS — Z0000 Encounter for general adult medical examination without abnormal findings: Secondary | ICD-10-CM

## 2018-02-13 ENCOUNTER — Other Ambulatory Visit: Payer: Self-pay

## 2018-02-13 DIAGNOSIS — L659 Nonscarring hair loss, unspecified: Secondary | ICD-10-CM

## 2018-02-13 DIAGNOSIS — E559 Vitamin D deficiency, unspecified: Secondary | ICD-10-CM

## 2018-02-13 DIAGNOSIS — Z0000 Encounter for general adult medical examination without abnormal findings: Secondary | ICD-10-CM

## 2018-02-14 LAB — COMPREHENSIVE METABOLIC PANEL
A/G RATIO: 2.1 (ref 1.2–2.2)
ALK PHOS: 60 IU/L (ref 39–117)
ALT: 17 IU/L (ref 0–32)
AST: 15 IU/L (ref 0–40)
Albumin: 4.4 g/dL (ref 3.5–5.5)
BILIRUBIN TOTAL: 0.5 mg/dL (ref 0.0–1.2)
BUN/Creatinine Ratio: 24 — ABNORMAL HIGH (ref 9–23)
BUN: 18 mg/dL (ref 6–24)
CHLORIDE: 101 mmol/L (ref 96–106)
CO2: 19 mmol/L — ABNORMAL LOW (ref 20–29)
Calcium: 9.2 mg/dL (ref 8.7–10.2)
Creatinine, Ser: 0.74 mg/dL (ref 0.57–1.00)
GFR calc non Af Amer: 95 mL/min/{1.73_m2} (ref >59)
GFR, EST AFRICAN AMERICAN: 109 mL/min/{1.73_m2} (ref >59)
GLUCOSE: 109 mg/dL — AB (ref 65–99)
Globulin, Total: 2.1 g/dL (ref 1.5–4.5)
POTASSIUM: 3.8 mmol/L (ref 3.5–5.2)
Sodium: 137 mmol/L (ref 134–144)
TOTAL PROTEIN: 6.5 g/dL (ref 6.0–8.5)

## 2018-02-14 LAB — CBC WITH DIFFERENTIAL/PLATELET
Basophils Absolute: 0 10*3/uL (ref 0.0–0.2)
Basos: 1 %
EOS (ABSOLUTE): 0.1 10*3/uL (ref 0.0–0.4)
Eos: 1 %
HEMATOCRIT: 36.1 % (ref 34.0–46.6)
Hemoglobin: 12.6 g/dL (ref 11.1–15.9)
IMMATURE GRANULOCYTES: 0 %
Immature Grans (Abs): 0 10*3/uL (ref 0.0–0.1)
LYMPHS ABS: 2.1 10*3/uL (ref 0.7–3.1)
Lymphs: 35 %
MCH: 30.7 pg (ref 26.6–33.0)
MCHC: 34.9 g/dL (ref 31.5–35.7)
MCV: 88 fL (ref 79–97)
MONOS ABS: 0.4 10*3/uL (ref 0.1–0.9)
Monocytes: 7 %
NEUTROS PCT: 56 %
Neutrophils Absolute: 3.5 10*3/uL (ref 1.4–7.0)
PLATELETS: 310 10*3/uL (ref 150–450)
RBC: 4.1 x10E6/uL (ref 3.77–5.28)
RDW: 12.1 % — ABNORMAL LOW (ref 12.3–15.4)
WBC: 6.1 10*3/uL (ref 3.4–10.8)

## 2018-02-14 LAB — LIPID PANEL
Chol/HDL Ratio: 2.3 ratio (ref 0.0–4.4)
Cholesterol, Total: 160 mg/dL (ref 100–199)
HDL: 71 mg/dL (ref >39)
LDL CALC: 80 mg/dL (ref 0–99)
Triglycerides: 47 mg/dL (ref 0–149)
VLDL CHOLESTEROL CAL: 9 mg/dL (ref 5–40)

## 2018-02-14 LAB — TESTOSTERONE,FREE AND TOTAL
Testosterone, Free: <0.2 pg/mL (ref 0.0–4.2)
Testosterone: <3 ng/dL — ABNORMAL LOW (ref 3–41)

## 2018-02-14 LAB — FERRITIN: FERRITIN: 86 ng/mL (ref 15–150)

## 2018-02-14 LAB — VITAMIN D 25 HYDROXY (VIT D DEFICIENCY, FRACTURES): Vit D, 25-Hydroxy: 40.5 ng/mL (ref 30.0–100.0)

## 2018-02-14 LAB — TSH: TSH: 1.53 u[IU]/mL (ref 0.450–4.500)

## 2018-05-03 ENCOUNTER — Ambulatory Visit: Payer: BLUE CROSS/BLUE SHIELD | Admitting: Cardiology

## 2019-08-26 ENCOUNTER — Encounter: Payer: Self-pay | Admitting: Cardiology

## 2019-08-26 NOTE — Progress Notes (Deleted)
Cardiology Office Note  Date: 08/26/2019   ID: Tracey Mcdaniel, DOB 1965-12-20, MRN [id number]  PCP:  Patient, No Pcp Per  Cardiologist:  Tracey Dell, MD Electrophysiologist:  None   No chief complaint on file.   History of Present Illness: Tracey Mcdaniel is a 54 y.o. female last seen in June 2019.  I reviewed interval records.  She was seen by Dr. Dorice Mcdaniel with the [facility] in [place].  She was evaluated at that time with history of hypertension and palpitations, mention made of PVCs and normal LVEF by echocardiography.  Limited Holter monitor report from August 2019 indicated mean heart rate of 88 bpm, reportedly no ventricular ectopy, 6 PACs, no pauses.  Past Medical History:  Diagnosis Date  . Allergic rhinitis   . Anxiety   . Eczema   . Endometriosis   . Essential hypertension   . Ovarian cyst     No past surgical history on file.  Current Outpatient Medications  Medication Sig Dispense Refill  . doxycycline (ORACEA) 40 MG capsule Take 40 mg by mouth every morning.    Marland Kitchen levonorgestrel-ethinyl estradiol (LYBREL,AMETHYST) 90-20 MCG tablet Take 1 tablet by mouth daily.    . metoprolol succinate (TOPROL XL) 25 MG 24 hr tablet Take 1 tablet (25 mg total) by mouth daily. 90 tablet 3  . metoprolol succinate (TOPROL-XL) 100 MG 24 hr tablet TAKE 1 TABLET BY MOUTH DAILY. TAKE WITH OR IMMEDIATELY FOLLOWING A MEAL. 90 tablet 3  . mometasone (NASONEX) 50 MCG/ACT nasal spray Place 2 sprays into the nose daily. 17 g 5  . Multiple Vitamins-Minerals (NICAZEL PO) Take 2 tablets by mouth daily.    . Salicylic Acid 2 % CREA Apply topically.    . triamterene-hydrochlorothiazide (MAXZIDE-25) 37.5-25 MG tablet Take 1 tablet by mouth daily. 90 tablet 3   No current facility-administered medications for this visit.   Allergies:  Patient has no known allergies.   ROS:  Please see the history of present illness. Otherwise, complete review of systems  is positive for {NONE DEFAULTED:18576::"none"}.  All other systems are reviewed and negative.   Physical Exam: VS:  There were no vitals taken for this visit., BMI There is no height or weight on file to calculate BMI.  Wt Readings from Last 3 Encounters:  09/15/17 175 lb (79.4 kg)  12/21/16 163 lb (73.9 kg)  06/16/16 181 lb (82.1 kg)    General: Patient appears comfortable at rest. HEENT: Conjunctiva and lids normal, oropharynx clear with moist mucosa. Neck: Supple, no elevated JVP or carotid bruits, no thyromegaly. Lungs: Clear to auscultation, nonlabored breathing at rest. Cardiac: Regular rate and rhythm, no S3 or significant systolic murmur, no pericardial rub. Abdomen: Soft, nontender, no hepatomegaly, bowel sounds present, no guarding or rebound. Extremities: No pitting edema, distal pulses 2+. Skin: Warm and dry. Musculoskeletal: No kyphosis. Neuropsychiatric: Alert and oriented x3, affect grossly appropriate.  ECG:  An ECG dated 12/21/2016 was personally reviewed today and demonstrated:  Sinus rhythm with nonspecific T wave changes.  Recent Labwork: No results found for requested labs within last 8760 hours.     Component Value Date/Time   CHOL 160 02/13/2018 0823   TRIG 47 02/13/2018 0823   HDL 71 02/13/2018 0823   CHOLHDL 2.3 02/13/2018 0823   LDLCALC 80 02/13/2018 0823    Other Studies Reviewed Today:  Echocardiogram 11/06/2017: Final Impressions 1. ECHOCARDIOGRAM 2. Normal left ventricular chamber size, wall thickness, and both systolic and diastolic  function. 3. Calculated 2-D biplane volumetric left ventricular ejection fraction 58 %. 4. Normal right ventricular size and systolic function. 5. Normal sized atria. 6. No significant valvular heart disease. 7. No pericardial effusion. 8. Unable to detect peak tricuspid regurgitation velocity for pulmonary artery systolic pressure calculation. 9. Normal inferior vena cava size with normal inspiratory collapse  (>50%). 10. There are no previous echocardiograms available for comparison.  Assessment and Plan:   Medication Adjustments/Labs and Tests Ordered: Current medicines are reviewed at length with the patient today.  Concerns regarding medicines are outlined above.   Tests Ordered: No orders of the defined types were placed in this encounter.   Medication Changes: No orders of the defined types were placed in this encounter.   Disposition:  Follow up {follow up:15908}  Signed, Satira Sark, MD, [facility] 08/26/2019 12:54 PM    [facility] at [facility] [address], [place], [place] [address] Phone: [phone]; Fax: [phone]

## 2019-08-27 ENCOUNTER — Ambulatory Visit: Payer: Self-pay | Admitting: Cardiology

## 2019-08-29 ENCOUNTER — Encounter: Payer: Self-pay | Admitting: Cardiology

## 2019-09-03 ENCOUNTER — Telehealth: Payer: Self-pay

## 2019-09-20 ENCOUNTER — Encounter: Payer: Self-pay | Admitting: Student

## 2019-09-20 ENCOUNTER — Telehealth (INDEPENDENT_AMBULATORY_CARE_PROVIDER_SITE_OTHER): Payer: Managed Care, Other (non HMO) | Admitting: Student

## 2019-09-20 VITALS — BP 104/58 | HR 91 | Ht 67.5 in | Wt 164.0 lb

## 2019-09-20 DIAGNOSIS — R002 Palpitations: Secondary | ICD-10-CM

## 2019-09-20 DIAGNOSIS — I10 Essential (primary) hypertension: Secondary | ICD-10-CM | POA: Diagnosis not present

## 2019-09-20 DIAGNOSIS — I493 Ventricular premature depolarization: Secondary | ICD-10-CM | POA: Diagnosis not present

## 2019-09-20 MED ORDER — TRIAMTERENE-HCTZ 37.5-25 MG PO TABS: 1.0000 | ORAL_TABLET | Freq: Every day | ORAL | 3 refills | Status: DC

## 2019-09-20 MED ORDER — VALSARTAN 160 MG PO TABS: 160.0000 mg | ORAL_TABLET | Freq: Every day | ORAL | 3 refills | Status: DC

## 2019-09-20 NOTE — Progress Notes (Signed)
Virtual Visit via Telephone Note   This visit type was conducted due to national recommendations for restrictions regarding the COVID-19 Pandemic (e.g. social distancing) in an effort to limit this patient's exposure and mitigate transmission in our community.  Due to her co-morbid illnesses, this patient is at least at moderate risk for complications without adequate follow up.  This format is felt to be most appropriate for this patient at this time.  The patient did not have access to video technology/had technical difficulties with video requiring transitioning to audio format only (telephone).  All issues noted in this document were discussed and addressed.  No physical exam could be performed with this format.  Please refer to the patient's chart for her  consent to telehealth for [facility].   The patient was identified using 2 identifiers.  Date:  09/20/2019   ID:  Tracey Mcdaniel, DOB [birth year], MRN [id number]  Patient Location: Home Provider Location: Office  PCP:  Patient, No Pcp Per  Cardiologist:  Nona Dell, MD  Electrophysiologist:  None   Evaluation Performed:  Follow-Up Visit  Chief Complaint: Palpitations  History of Present Illness:    Tracey Mcdaniel is a 54 y.o. female with past medical history of HTN and palpitations (PVC's by prior monitor in 2015) who presents to the office today for overdue follow-up.   She was last examined by Dr. Diona Browner in 09/2017 and reported occasional palpitations with stress but denied any progression of her symptoms. She was continued on her current medication regimen with Toprol-XL 100mg  daily and Triamterene-HCTZ 37.5-25mg  daily.  In talking with the patient today, she reports she was residing in for some time and recently relocated back to [place] and is reestablishing care. It appears that beta-blocker therapy was discontinued during that timeframe due to significant fatigue by review of  notes in Care Everywhere.  She reports still having intermittent palpitations which occur on a daily basis and can last from a few seconds up to 2 minutes. No associated symptoms. She does describe some episodes of a tightness along her chest which occurs in a stressful scenario but no specific exertional pain. No recent orthopnea, PND or edema.  She does not consume a significant amount of caffeine. No alcohol use.  The patient does not have symptoms concerning for COVID-19 infection (fever, chills, cough, or new shortness of breath).    Past Medical History:  Diagnosis Date  . Allergic rhinitis   . Anxiety   . Eczema   . Endometriosis   . Essential hypertension   . Ovarian cyst    History reviewed. No pertinent surgical history.   Current Meds  Medication Sig  . doxycycline (ORACEA) 40 MG capsule Take 40 mg by mouth every morning.  [place] levonorgestrel-ethinyl estradiol (LYBREL,AMETHYST) 90-20 MCG tablet Take 1 tablet by mouth daily.  . mometasone (NASONEX) 50 MCG/ACT nasal spray Place 2 sprays into the nose daily.  . Multiple Vitamins-Minerals (NICAZEL PO) Take 2 tablets by mouth daily.  . Salicylic Acid 2 % CREA Apply topically.  . triamterene-hydrochlorothiazide (MAXZIDE-25) 37.5-25 MG tablet Take 1 tablet by mouth daily.  . valsartan (DIOVAN) 160 MG tablet Take 1 tablet (160 mg total) by mouth daily.  . [DISCONTINUED] triamterene-hydrochlorothiazide (MAXZIDE-25) 37.5-25 MG tablet Take 1 tablet by mouth daily.  . [DISCONTINUED] valsartan (DIOVAN) 160 MG tablet      Allergies:   Patient has no known allergies.   Social History   Tobacco Use  .  Smoking status: Never Smoker  . Smokeless tobacco: Never Used  Vaping Use  . Vaping Use: Never used  Substance Use Topics  . Alcohol use: No    Alcohol/week: 0.0 standard drinks  . Drug use: No     Family Hx: The patient's family history includes Arrhythmia in her mother; Atrial fibrillation in her father.  ROS:   Please see  the history of present illness.     All other systems reviewed and are negative.   Prior CV studies:   The following studies were reviewed today:  EKG: 12/21/2016: NSR, HR 79 with no acute ST abnormalities.   GXT: 04/2013 Conclusions 1. Negative exercise stress test for ischemia 2. Duke treadmill score 10 consistent with low risk for major cardiac events 3. Excellent functional capactiy (140% of predicted based on age and gender) 4. No evidence of arrhythmia or preexcitation  Echocardiogram: 10/2017 Final Impressions  1. ECHOCARDIOGRAM  2. Normal left ventricular chamber size, wall thickness, and both systolic and diastolic  function.  3. Calculated 2-D biplane volumetric left ventricular ejection fraction 58 %.  4. Normal right ventricular size and systolic function.  5. Normal sized atria.  6. No significant valvular heart disease.  7. No pericardial effusion.  8. Unable to detect peak tricuspid regurgitation velocity for pulmonary artery systolic  pressure calculation.  9. Normal inferior vena cava size with normal inspiratory collapse (>50%).  10. There are no previous echocardiograms available for comparison.    Labs/Other Tests and Data Reviewed:    EKG:  An ECG dated 08/15/2019 was personally reviewed today and demonstrated:  NSR with PAC's. No acute ST abnormalities when compared to prior tracings.   Recent Labs: No results found for requested labs within last 8760 hours.   Recent Lipid Panel Lab Results  Component Value Date/Time   CHOL 160 02/13/2018 08:23 AM   TRIG 47 02/13/2018 08:23 AM   HDL 71 02/13/2018 08:23 AM   CHOLHDL 2.3 02/13/2018 08:23 AM   LDLCALC 80 02/13/2018 08:23 AM    Wt Readings from Last 3 Encounters:  09/20/19 164 lb (74.4 kg)  09/15/17 175 lb (79.4 kg)  12/21/16 163 lb (73.9 kg)     Objective:    Vital Signs:  BP (!) 104/58   Pulse 91   Ht 5' 7.5" (1.715 m)   Wt 164 lb (74.4 kg)   BMI 25.31 kg/m    General: Pleasant female  sounding in NAD Psych: Normal affect. Neuro: Alert and oriented X 3.  Lungs:  Resp regular and unlabored while talking on the phone.   ASSESSMENT & PLAN:    1. Palpitations/PVC's - She experiences palpitations a daily basis and symptoms typically last from a few seconds up to 2 minutes. Her palpitations have been occurring for several years and have not increased in frequency or severity. She was previously on Toprol-XL but this was discontinued in the interim secondary to fatigue.  - We reviewed alternates to BB therapy such as short-acting Cardizem or Cardizem CD but given her stable symptoms, she wishes to continue her current regimen for now. If symptoms progress, I encouraged her to make [place] aware as this could be sent in. Would likely need to reduce her Valsartan dose if starting a CCB given her soft BP.  - She has not had recent labs so will update CBC, TSH, BMET and Mg.   2. HTN - BP was well-controled at 104/58 on most recent check. She remains on Triamterene-HCTZ 37.5-25mg  daily and  Valsartan 160mg  daily.    COVID-19 Education: The signs and symptoms of COVID-19 were discussed with the patient and how to seek care for testing (follow up with PCP or arrange E-visit).  The importance of social distancing was discussed today.  Time:   Today, I have spent 14 minutes with the patient with telehealth technology discussing the above problems.     Medication Adjustments/Labs and Tests Ordered: Current medicines are reviewed at length with the patient today.  Concerns regarding medicines are outlined above.   Tests Ordered: Orders Placed This Encounter  Procedures  . TSH  . CBC  . Basic Metabolic Panel (BMET)  . Magnesium    Medication Changes: Meds ordered this encounter  Medications  . triamterene-hydrochlorothiazide (MAXZIDE-25) 37.5-25 MG tablet    Sig: Take 1 tablet by mouth daily.    Dispense:  90 tablet    Refill:  3    Order Specific Question:   Supervising Provider      Answer:   01-16-1992 Lars Masson  . valsartan (DIOVAN) 160 MG tablet    Sig: Take 1 tablet (160 mg total) by mouth daily.    Dispense:  90 tablet    Refill:  3    Order Specific Question:   Supervising Provider    Answer:   [id number] Lars Masson    Follow Up:  In Person in 6 month(s)  Signed, [[id number]], Ellsworth Lennox  09/20/2019 8:38 PM    [facility]

## 2019-09-20 NOTE — Patient Instructions (Signed)
Medication Instructions:  Your physician recommends that you continue on your current medications as directed. Please refer to the Current Medication list given to you today.  *If you need a refill on your cardiac medications before your next appointment, please call your pharmacy*   Lab Work: Your physician recommends that you return for lab work in: TSH, CBC, BMET, Mg  If you have labs (blood work) drawn today and your tests are completely normal, you will receive your results only by: Marland Kitchen MyChart Message (if you have MyChart) OR . A paper copy in the mail If you have any lab test that is abnormal or we need to change your treatment, we will call you to review the results.   Testing/Procedures: NONE   Follow-Up: At [facility], you and your health needs are our priority.  As part of our continuing mission to provide you with exceptional heart care, we have created designated Provider Care Teams.  These Care Teams include your primary Cardiologist (physician) and Advanced Practice Providers (APPs -  Physician Assistants and Nurse Practitioners) who all work together to provide you with the care you need, when you need it.  We recommend signing up for the patient portal called "MyChart".  Sign up information is provided on this After Visit Summary.  MyChart is used to connect with patients for Virtual Visits (Telemedicine).  Patients are able to view lab/test results, encounter notes, upcoming appointments, etc.  Non-urgent messages can be sent to your provider as well.   To learn more about what you can do with MyChart, go to [email or web address].    Your next appointment:   6 month(s)  The format for your next appointment:   In Person  Provider:   Nona Dell, MD or Randall An, PA-C   Other Instructions Thank you for choosing [facility]!

## 2019-12-06 ENCOUNTER — Telehealth: Payer: Self-pay | Admitting: Cardiology

## 2019-12-06 MED ORDER — VALSARTAN 160 MG PO TABS: 160.0000 mg | ORAL_TABLET | Freq: Two times a day (BID) | ORAL | 3 refills | Status: DC

## 2019-12-06 NOTE — Telephone Encounter (Signed)
    Can provide Rx for BID dosing. BP was well-controlled at the time of her last visit and if she was taking the medication as BID dosing, would continue at this time.   Signed, Ellsworth Lennox, PA-C 12/06/2019, 12:28 PM Pager: [phone]

## 2019-12-06 NOTE — Telephone Encounter (Signed)
Spoke with pt who states that she takes valsartan 160mg  two times daily. Pt reports that [facility] clinic had her taking valsartan this way. Pt would like to know if she needs to call them for a RX or if we can fix order sent in?

## 2019-12-06 NOTE — Telephone Encounter (Signed)
Refill complete 

## 2019-12-06 NOTE — Telephone Encounter (Signed)
New message     Patient called left message on the voicemail that her prescription was called in wrong   She is suppose to be taking valsartan (DIOVAN) 160 MG tablet 2x a day not once a day , please correct prescription

## 2020-03-24 ENCOUNTER — Ambulatory Visit (INDEPENDENT_AMBULATORY_CARE_PROVIDER_SITE_OTHER): Payer: Managed Care, Other (non HMO) | Admitting: Cardiology

## 2020-03-24 ENCOUNTER — Encounter: Payer: Self-pay | Admitting: Cardiology

## 2020-03-24 ENCOUNTER — Encounter: Payer: Self-pay | Admitting: *Deleted

## 2020-03-24 VITALS — BP 112/80 | HR 84 | Ht 67.0 in | Wt 171.4 lb

## 2020-03-24 DIAGNOSIS — I10 Essential (primary) hypertension: Secondary | ICD-10-CM

## 2020-03-24 DIAGNOSIS — R002 Palpitations: Secondary | ICD-10-CM

## 2020-03-24 NOTE — Progress Notes (Signed)
Cardiology Office Note  Date: 03/24/2020   ID: Tracey Mcdaniel, DOB 08-Jun-1965, MRN [id number]  PCP:  Assunta Found, MD  Cardiologist:  Nona Dell, MD Electrophysiologist:  None   Chief Complaint  Patient presents with  . Cardiac follow-up    History of Present Illness: Tracey Mcdaniel is a 54 y.o. female last assessed via telehealth encounter in June by Ms. Strader PA-C.  She presents for a routine visit.  She reports intermittent palpitations and feeling of rapid heartbeat, not progressive or associated with syncope.  I reviewed her current medications, she is no longer on beta-blocker.  Metoprolol has been stopped previously due to fatigue.  We did discuss the possibility of considering low-dose atenolol depending on palpitation frequency.  She does have documented PVCs.  LVEF is normal.  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  Past Medical History:  Diagnosis Date  . Allergic rhinitis   . Anxiety   . Eczema   . Endometriosis   . Essential hypertension   . Ovarian cyst     History reviewed. No pertinent surgical history.  Current Outpatient Medications  Medication Sig Dispense Refill  . doxycycline (ORACEA) 40 MG capsule Take 40 mg by mouth daily as needed.    . mometasone (NASONEX) 50 MCG/ACT nasal spray Place 2 sprays into the nose daily. (Patient taking differently: Place 2 sprays into the nose daily as needed.) 17 g 5  . Multiple Vitamins-Minerals (NICAZEL PO) Take 2 tablets by mouth daily.    . Salicylic Acid 6 % CREA Apply 1 application topically as needed.    . triamterene-hydrochlorothiazide (MAXZIDE-25) 37.5-25 MG tablet Take 1 tablet by mouth daily. 90 tablet 3  . valsartan (DIOVAN) 160 MG tablet Take 1 tablet (160 mg total) by mouth 2 (two) times daily. 180 tablet 3   No current facility-administered medications for this visit.   Allergies:  Patient has no known allergies.   ROS: No  syncope.  Physical Exam: VS:  BP 112/80   Pulse 84   Ht 5\' 7"  (1.702 m)   Wt 171 lb 6.4 oz (77.7 kg)   SpO2 97%   BMI 26.85 kg/m , BMI Body mass index is 26.85 kg/m.  Wt Readings from Last 3 Encounters:  03/24/20 171 lb 6.4 oz (77.7 kg)  09/20/19 164 lb (74.4 kg)  09/15/17 175 lb (79.4 kg)    General: Patient appears comfortable at rest. HEENT: Conjunctiva and lids normal, wearing a mask. Lungs: Clear to auscultation, nonlabored breathing at rest. Cardiac: Regular rate and rhythm, no S3 or significant systolic murmur, no pericardial rub. Extremities: No pitting edema.  ECG:  An ECG dated 08/15/2019 was personally reviewed today and demonstrated:  Sinus rhythm with PACs.  Recent Labwork:  No interval lab work for review today.  Other Studies Reviewed Today:  Echocardiogram 7/29/201 [facility]): Final Impressions  1. ECHOCARDIOGRAM  2. Normal left ventricular chamber size, wall thickness, and both systolic and diastolic  function.  3. Calculated 2-D biplane volumetric left ventricular ejection fraction 58 %.  4. Normal right ventricular size and systolic function.  5. Normal sized atria.  6. No significant valvular heart disease.  7. No pericardial effusion.  8. Unable to detect peak tricuspid regurgitation velocity for pulmonary artery systolic  pressure calculation.  9. Normal inferior vena cava size with normal inspiratory collapse (>50%).  10. There are no previous echocardiograms available for comparison.   Assessment and  Plan:  1.  History of palpitations and intermittently documented PVCs.  After discussion today, plan is observation.  If symptoms escalate we can always consider initiating low-dose beta-blocker in different formulations such as atenolol.  2.  Essential hypertension, blood pressure is well controlled today on current dose of Diovan.  No changes were made.  Medication Adjustments/Labs and Tests Ordered: Current medicines are reviewed  at length with the patient today.  Concerns regarding medicines are outlined above.   Tests Ordered: No orders of the defined types were placed in this encounter.   Medication Changes: No orders of the defined types were placed in this encounter.   Disposition:  Follow up 6 months.  Signed, Jonelle Sidle, MD, [facility] 03/24/2020 4:50 PM    [facility] at [facility] [address] [place], [place], [place] [address] Phone: [phone]; Fax: [phone]

## 2020-03-24 NOTE — Patient Instructions (Signed)

## 2020-11-07 ENCOUNTER — Other Ambulatory Visit: Payer: Self-pay | Admitting: Student

## 2020-12-19 ENCOUNTER — Other Ambulatory Visit: Payer: Self-pay | Admitting: Student

## 2021-01-14 ENCOUNTER — Encounter: Payer: Self-pay | Admitting: Student

## 2021-01-14 ENCOUNTER — Telehealth (INDEPENDENT_AMBULATORY_CARE_PROVIDER_SITE_OTHER): Payer: Managed Care, Other (non HMO) | Admitting: Student

## 2021-01-14 VITALS — BP 105/59 | HR 91 | Ht 67.5 in | Wt 174.0 lb

## 2021-01-14 DIAGNOSIS — R002 Palpitations: Secondary | ICD-10-CM | POA: Diagnosis not present

## 2021-01-14 DIAGNOSIS — I10 Essential (primary) hypertension: Secondary | ICD-10-CM | POA: Diagnosis not present

## 2021-01-14 MED ORDER — VALSARTAN 160 MG PO TABS: 160.0000 mg | ORAL_TABLET | Freq: Two times a day (BID) | ORAL | 3 refills | Status: DC

## 2021-01-14 MED ORDER — TRIAMTERENE-HCTZ 37.5-25 MG PO TABS: 1.0000 | ORAL_TABLET | Freq: Every day | ORAL | 3 refills | Status: DC

## 2021-01-14 NOTE — Patient Instructions (Signed)
Medication Instructions:  Your physician recommends that you continue on your current medications as directed. Please refer to the Current Medication list given to you today.  *If you need a refill on your cardiac medications before your next appointment, please call your pharmacy*   Lab Work: We have requested your most recent labs from your primary care provider  If you have labs (blood work) drawn today and your tests are completely normal, you will receive your results only by: MyChart Message (if you have MyChart) OR A paper copy in the mail If you have any lab test that is abnormal or we need to change your treatment, we will call you to review the results.   Testing/Procedures: None   Follow-Up: At [facility], you and your health needs are our priority.  As part of our continuing mission to provide you with exceptional heart care, we have created designated Provider Care Teams.  These Care Teams include your primary Cardiologist (physician) and Advanced Practice Providers (APPs -  Physician Assistants and Nurse Practitioners) who all work together to provide you with the care you need, when you need it.  We recommend signing up for the patient portal called "MyChart".  Sign up information is provided on this After Visit Summary.  MyChart is used to connect with patients for Virtual Visits (Telemedicine).  Patients are able to view lab/test results, encounter notes, upcoming appointments, etc.  Non-urgent messages can be sent to your provider as well.   To learn more about what you can do with MyChart, go to [email or web address].    Your next appointment:   1 year(s)  The format for your next appointment:   In Person  Provider:   You will see one of the following Advanced Practice Providers on your designated Care Team:   [place], PA-C  Jacolyn Reedy, [place]      Other Instructions

## 2021-01-14 NOTE — Progress Notes (Addendum)
Virtual Visit via Telephone Note   This visit type was conducted due to national recommendations for restrictions regarding the COVID-19 Pandemic (e.g. social distancing) in an effort to limit this patient's exposure and mitigate transmission in our community.  Due to her co-morbid illnesses, this patient is at least at moderate risk for complications without adequate follow up.  This format is felt to be most appropriate for this patient at this time.  The patient did not have access to video technology/had technical difficulties with video requiring transitioning to audio format only (telephone).  All issues noted in this document were discussed and addressed.  No physical exam could be performed with this format.  Please refer to the patient's chart for her  consent to telehealth for [facility].   Date:  01/14/2021   ID:  Tracey Mcdaniel, DOB 01-09-1966, MRN [id number] The patient was identified using 2 identifiers.  Patient Location: Home Provider Location: Office/Clinic   PCP:  Assunta Found, MD   [facility] Providers Cardiologist:  Nona Dell, MD {  Evaluation Performed:  Follow-Up Visit  Chief Complaint: 6 month visit  History of Present Illness:    Tracey Mcdaniel is a 55 y.o. female with with past medical history of HTN and palpitations (PVC's by prior monitor in 2015) who presents for a 3-month follow-up telehealth visit.   She was last examined by Dr. Diona Browner in 03/2020 and did report intermittent palpitations but was no longer on beta-blocker therapy due to fatigue with Metoprolol. She wished to remain off medical therapy at that time and it was recommended that if she had recurrent symptoms, could consider initiating low-dose Atenolol.  In talking with the patient today, she reports her palpitations have overall been well-controlled and she does not feel like she needs medications for them at this time. She denies any recent exertional chest  pain or dyspnea on exertion. She has gained weight since being in menopause and has also experienced pelvic prolapse which has limited some of her activity. However, she still remains active and at least tries to use her stationary bike, walk or use a rowing machine for 15 minutes a day and exercises for an hour a day 2 to 3 days each week with her kids. No reported orthopnea or PND. She does experience intermittent lower extremity edema and remains on HCTZ but does not take this daily.  The patient does not have symptoms concerning for COVID-19 infection (fever, chills, cough, or new shortness of breath).    Past Medical History:  Diagnosis Date   Allergic rhinitis    Anxiety    Eczema    Endometriosis    Essential hypertension    Ovarian cyst    History reviewed. No pertinent surgical history.   Current Meds  Medication Sig   doxycycline (ORACEA) 40 MG capsule Take 40 mg by mouth daily as needed.   mometasone (NASONEX) 50 MCG/ACT nasal spray Place 2 sprays into the nose daily. (Patient taking differently: Place 2 sprays into the nose daily as needed.)   Multiple Vitamins-Minerals (NICAZEL PO) Take 2 tablets by mouth daily.   Salicylic Acid 6 % CREA Apply 1 application topically as needed.   [DISCONTINUED] triamterene-hydrochlorothiazide (MAXZIDE-25) 37.5-25 MG tablet TAKE 1 TABLET BY MOUTH EVERY DAY   [DISCONTINUED] valsartan (DIOVAN) 160 MG tablet TAKE 1 TABLET (160 MG TOTAL) BY MOUTH 2 (TWO) TIMES DAILY.     Allergies:   Patient has no known allergies.   Social History  Tobacco Use   Smoking status: Never   Smokeless tobacco: Never  Vaping Use   Vaping Use: Never used  Substance Use Topics   Alcohol use: No    Alcohol/week: 0.0 standard drinks   Drug use: No     Family Hx: The patient's family history includes Arrhythmia in her mother; Atrial fibrillation in her father; Kidney disease in her father.  ROS:   Please see the history of present illness.     All other  systems reviewed and are negative.   Labs/Other Tests and Data Reviewed:    EKG:  An ECG dated 03/27/2020 was personally reviewed today and demonstrated:  NSR, HR in 70's with no acute ST changes.   Recent Labs: No results found for requested labs within last 8760 hours.   Recent Lipid Panel Lab Results  Component Value Date/Time   CHOL 160 02/13/2018 08:23 AM   TRIG 47 02/13/2018 08:23 AM   HDL 71 02/13/2018 08:23 AM   CHOLHDL 2.3 02/13/2018 08:23 AM   LDLCALC 80 02/13/2018 08:23 AM    Wt Readings from Last 3 Encounters:  01/14/21 174 lb (78.9 kg)  03/24/20 171 lb 6.4 oz (77.7 kg)  09/20/19 164 lb (74.4 kg)     Objective:    Vital Signs:  BP (!) 105/59   Pulse 91   Ht 5' 7.5" (1.715 m)   Wt 174 lb (78.9 kg)   BMI 26.85 kg/m    General: Pleasant female sounding in NAD Psych: Normal affect. Neuro: Alert and oriented X 3.  Lungs:  Resp regular and unlabored while talking on the phone.    ASSESSMENT & PLAN:    1. Palpitations - Her symptoms have overall been well-controlled and she prefers to remain off medical therapy at this time. She does limit caffeine intake. Continue with conservative measures.  As previously recommended, could consider using low-dose Atenolol or short-acting Cardizem in the future if needed.  2. HTN - Her blood pressure has been well-controlled when checked at home, at 105/59 when checked today but she reports her systolic readings are typically in the 110's. Continue current regimen with Triamterene-HCTZ 37.5-25 mg daily and Valsartan 160 mg twice daily. Will request most recent labs from her PCP.    Time:   Today, I have spent 19 minutes with the patient with telehealth technology discussing the above problems.     Medication Adjustments/Labs and Tests Ordered: Current medicines are reviewed at length with the patient today.  Concerns regarding medicines are outlined above.   Tests Ordered: No orders of the defined types were placed in  this encounter.   Medication Changes: Meds ordered this encounter  Medications   triamterene-hydrochlorothiazide (MAXZIDE-25) 37.5-25 MG tablet    Sig: Take 1 tablet by mouth daily.    Dispense:  90 tablet    Refill:  3    Order Specific Question:   Supervising Provider    Answer:   Antoine Poche [[id number]]   valsartan (DIOVAN) 160 MG tablet    Sig: Take 1 tablet (160 mg total) by mouth 2 (two) times daily.    Dispense:  180 tablet    Refill:  3    Order Specific Question:   Supervising Provider    Answer:   Antoine Poche [[id number]]    Follow Up:  In Person in 1 year(s)  Signed, Ellsworth Lennox, PA-C  01/14/2021 5:07 PM    [facility]

## 2022-01-24 ENCOUNTER — Other Ambulatory Visit: Payer: Self-pay | Admitting: Student

## 2022-03-06 ENCOUNTER — Other Ambulatory Visit: Payer: Self-pay | Admitting: Cardiology

## 2022-05-11 ENCOUNTER — Ambulatory Visit: Payer: Managed Care, Other (non HMO) | Attending: Cardiology | Admitting: Cardiology

## 2022-05-11 ENCOUNTER — Encounter: Payer: Self-pay | Admitting: Cardiology

## 2022-05-11 VITALS — BP 116/82 | HR 90 | Ht 67.5 in | Wt 183.0 lb

## 2022-05-11 DIAGNOSIS — I10 Essential (primary) hypertension: Secondary | ICD-10-CM

## 2022-05-11 DIAGNOSIS — R002 Palpitations: Secondary | ICD-10-CM | POA: Diagnosis not present

## 2022-05-11 MED ORDER — VALSARTAN 160 MG PO TABS: 160.0000 mg | ORAL_TABLET | Freq: Every day | ORAL | 3 refills | Status: DC

## 2022-05-11 NOTE — Patient Instructions (Signed)
Medication Instructions:  Your physician recommends that you continue on your current medications as directed. Please refer to the Current Medication list given to you today.   Labwork: None  Testing/Procedures: None  Follow-Up: Follow up with Dr. Domenic Polite in 1 year.   Any Other Special Instructions Will Be Listed Below (If Applicable).     If you need a refill on your cardiac medications before your next appointment, please call your pharmacy.

## 2022-05-11 NOTE — Progress Notes (Signed)
Cardiology Office Note  Date: 05/11/2022   ID: Tracey Mcdaniel, DOB 12/08/1965, MRN [id number]  PCP:  Sharilyn Sites, MD  Cardiologist:  Rozann Lesches, MD Electrophysiologist:  None   Chief Complaint  Patient presents with   Cardiac follow-up    History of Present Illness: Tracey Mcdaniel is a 57 y.o. female last assessed via telehealth encounter by Ms. Strader PA-C in October 2022, I reviewed the note.  She is here for a follow-up visit.  Reports no sense of palpitations.  She injured her right foot in October 2023 and has been more sedentary since that time.  She has gained weight over the last year.  She is focused on diet and try to increase her activity level again.  We went over her medications.  She cut her Diovan back to 160 mg daily from twice daily.  Does check blood pressure periodically at home.  I rechecked her blood pressure today after being seated for several minutes at 116/82.  She has a [employer] watch and has gotten some alerts on her phone for atrial fibrillation and inconclusive rhythm.  I reviewed these today and they show sinus rhythm throughout with lead artifact.  Rarely there is a PAC.  No definitive atrial fibrillation however.  I personally reviewed her ECG today which shows normal sinus rhythm.  Past Medical History:  Diagnosis Date   Allergic rhinitis    Anxiety    Eczema    Endometriosis    Essential hypertension    Ovarian cyst     Current Outpatient Medications  Medication Sig Dispense Refill   finasteride (PROSCAR) 5 MG tablet Take 5 mg by mouth daily.     Salicylic Acid 6 % CREA Apply 1 application topically as needed.     triamterene-hydrochlorothiazide (MAXZIDE-25) 37.5-25 MG tablet TAKE 1 TABLET BY MOUTH EVERY DAY 90 tablet 0   doxycycline (ORACEA) 40 MG capsule Take 40 mg by mouth daily as needed. (Patient not taking: Reported on 05/11/2022)     mometasone (NASONEX) 50 MCG/ACT nasal spray Place 2 sprays into the  nose daily. (Patient not taking: Reported on 05/11/2022) 17 g 5   valsartan (DIOVAN) 160 MG tablet Take 1 tablet (160 mg total) by mouth daily. 90 tablet 3   No current facility-administered medications for this visit.   Allergies:  Patient has no known allergies.   ROS: No orthopnea or PND.  No syncope.  Physical Exam: VS:  BP 116/82 (BP Location: Right Arm)   Pulse 90   Ht 5' 7.5" (1.715 m)   Wt 183 lb (83 kg)   BMI 28.24 kg/m , BMI Body mass index is 28.24 kg/m.  Wt Readings from Last 3 Encounters:  05/11/22 183 lb (83 kg)  01/14/21 174 lb (78.9 kg)  03/24/20 171 lb 6.4 oz (77.7 kg)    General: Patient appears comfortable at rest. HEENT: Conjunctiva and lids normal. Lungs: Clear to auscultation, nonlabored breathing at rest. Cardiac: Regular rate and rhythm, no S3 or significant systolic murmur.  ECG:  An ECG dated 03/27/2020 was personally reviewed today and demonstrated:  Sinus rhythm.  Recent Labwork:  No recent lab work for review today.  Other Studies Reviewed Today:  No interval cardiac testing for review today.  Assessment and Plan:  1.  History of palpitations with previously documented PVCs.  Symptoms are fairly quiet at this point.  I reviewed her [employer] smart watch readings which show sinus rhythm, rare PAC or PVC.  No atrial fibrillation.  We do not have her on beta-blocker at this point.  Continue observation for now.  2.  Essential hypertension.  Okay to continue Diovan 160 mg daily instead of twice daily, also stable dose of Maxide.  Continue to track blood pressure at home.  Also focus on diet and exercise with goal for further weight loss.  3.  Health maintenance, I recommended that she see her PCP this year for a wellness exam with lab work.  Medication Adjustments/Labs and Tests Ordered: Current medicines are reviewed at length with the patient today.  Concerns regarding medicines are outlined above.   Tests Ordered: Orders Placed This Encounter   Procedures   EKG 12-Lead    Medication Changes: Meds ordered this encounter  Medications   valsartan (DIOVAN) 160 MG tablet    Sig: Take 1 tablet (160 mg total) by mouth daily.    Dispense:  90 tablet    Refill:  3    Disposition:  Follow up  1 year.  Signed, Satira Sark, MD, [facility] 05/11/2022 4:46 PM    [facility] at [facility] [address], [place], [place] [address] Phone: [phone]; Fax: [phone]

## 2022-12-08 ENCOUNTER — Other Ambulatory Visit: Payer: Self-pay | Admitting: General Surgery

## 2022-12-08 DIAGNOSIS — N6489 Other specified disorders of breast: Secondary | ICD-10-CM

## 2022-12-13 ENCOUNTER — Other Ambulatory Visit: Payer: Self-pay

## 2022-12-13 ENCOUNTER — Encounter (HOSPITAL_BASED_OUTPATIENT_CLINIC_OR_DEPARTMENT_OTHER): Payer: Self-pay | Admitting: General Surgery

## 2022-12-20 ENCOUNTER — Encounter (HOSPITAL_BASED_OUTPATIENT_CLINIC_OR_DEPARTMENT_OTHER)
Admission: RE | Admit: 2022-12-20 | Discharge: 2022-12-20 | Disposition: A | Discharge status: Home / Self Care | Payer: Managed Care, Other (non HMO) | Source: Ambulatory Visit | Attending: General Surgery

## 2022-12-20 DIAGNOSIS — Z01812 Encounter for preprocedural laboratory examination: Secondary | ICD-10-CM | POA: Insufficient documentation

## 2022-12-20 DIAGNOSIS — I10 Essential (primary) hypertension: Secondary | ICD-10-CM | POA: Diagnosis not present

## 2022-12-20 DIAGNOSIS — N62 Hypertrophy of breast: Secondary | ICD-10-CM | POA: Diagnosis not present

## 2022-12-20 DIAGNOSIS — N6082 Other benign mammary dysplasias of left breast: Secondary | ICD-10-CM | POA: Diagnosis present

## 2022-12-20 DIAGNOSIS — N6489 Other specified disorders of breast: Secondary | ICD-10-CM | POA: Diagnosis not present

## 2022-12-20 DIAGNOSIS — F419 Anxiety disorder, unspecified: Secondary | ICD-10-CM | POA: Diagnosis not present

## 2022-12-20 LAB — BASIC METABOLIC PANEL
Anion gap: 10 (ref 5–15)
BUN: 20 mg/dL (ref 6–20)
CO2: 26 mmol/L (ref 22–32)
Calcium: 9.3 mg/dL (ref 8.9–10.3)
Chloride: 99 mmol/L (ref 98–111)
Creatinine, Ser: 0.57 mg/dL (ref 0.44–1.00)
GFR, Estimated: >60 mL/min (ref >60)
Glucose, Bld: 93 mg/dL (ref 70–99)
Potassium: 3.6 mmol/L (ref 3.5–5.1)
Sodium: 135 mmol/L (ref 135–145)

## 2022-12-20 MED ORDER — CHLORHEXIDINE GLUCONATE CLOTH 2 % EX PADS: 6.0000 | MEDICATED_PAD | Freq: Once | CUTANEOUS | Status: DC

## 2022-12-20 MED ORDER — ENSURE PRE-SURGERY PO LIQD: 296.0000 mL | Freq: Once | ORAL | Status: DC

## 2022-12-20 NOTE — Progress Notes (Signed)
Enhanced Recovery after Surgery  Enhanced Recovery after Surgery is a protocol used to improve the stress on your body and your recovery after surgery.  Patient Instructions  The night before surgery:  No food after midnight. ONLY clear liquids after midnight  The day of surgery (if you do NOT have diabetes):  Drink ONE (1) Pre-Surgery Clear Ensure as directed.   This drink was given to you during your hospital  pre-op appointment visit. The pre-op nurse will instruct you on the time to drink the  Pre-Surgery Ensure depending on your surgery time. Finish the drink at the designated time by the pre-op nurse.  Nothing else to drink after completing the  Pre-Surgery Clear Ensure.  The day of surgery (if you have diabetes): Drink ONE (1) Gatorade 2 (G2) as directed. This drink was given to you during your hospital  pre-op appointment visit.  The pre-op nurse will instruct you on the time to drink the   Gatorade 2 (G2) depending on your surgery time. Color of the Gatorade may vary. Red is not allowed. Nothing else to drink after completing the  Gatorade 2 (G2).         If office.you have questions, please contact your surgeon's officeSurgical soap given with instructions, pt verbalized understanding.Surgical soap given with instructions, pt verbalized understanding. 

## 2022-12-21 ENCOUNTER — Other Ambulatory Visit: Payer: Self-pay

## 2022-12-21 ENCOUNTER — Encounter (HOSPITAL_BASED_OUTPATIENT_CLINIC_OR_DEPARTMENT_OTHER)
Admission: RE | Disposition: A | Discharge status: Home / Self Care | Payer: Self-pay | Source: Ambulatory Visit | Attending: General Surgery

## 2022-12-21 ENCOUNTER — Ambulatory Visit (HOSPITAL_BASED_OUTPATIENT_CLINIC_OR_DEPARTMENT_OTHER): Payer: Self-pay | Admitting: Certified Registered"

## 2022-12-21 ENCOUNTER — Encounter (HOSPITAL_BASED_OUTPATIENT_CLINIC_OR_DEPARTMENT_OTHER): Payer: Self-pay | Admitting: General Surgery

## 2022-12-21 ENCOUNTER — Ambulatory Visit (HOSPITAL_BASED_OUTPATIENT_CLINIC_OR_DEPARTMENT_OTHER): Payer: Managed Care, Other (non HMO) | Admitting: Certified Registered"

## 2022-12-21 ENCOUNTER — Ambulatory Visit (HOSPITAL_BASED_OUTPATIENT_CLINIC_OR_DEPARTMENT_OTHER)
Admission: RE | Admit: 2022-12-21 | Discharge: 2022-12-21 | Disposition: A | Discharge status: Home / Self Care | Payer: Managed Care, Other (non HMO) | Source: Ambulatory Visit | Attending: General Surgery | Admitting: General Surgery

## 2022-12-21 DIAGNOSIS — N6320 Unspecified lump in the left breast, unspecified quadrant: Secondary | ICD-10-CM

## 2022-12-21 DIAGNOSIS — I10 Essential (primary) hypertension: Secondary | ICD-10-CM | POA: Insufficient documentation

## 2022-12-21 DIAGNOSIS — N62 Hypertrophy of breast: Secondary | ICD-10-CM | POA: Insufficient documentation

## 2022-12-21 DIAGNOSIS — N6489 Other specified disorders of breast: Secondary | ICD-10-CM | POA: Insufficient documentation

## 2022-12-21 DIAGNOSIS — F419 Anxiety disorder, unspecified: Secondary | ICD-10-CM | POA: Insufficient documentation

## 2022-12-21 HISTORY — PX: RADIOACTIVE SEED GUIDED EXCISIONAL BREAST BIOPSY: SHX6490

## 2022-12-21 SURGERY — RADIOACTIVE SEED GUIDED BREAST BIOPSY
Anesthesia: General | Site: Breast | Laterality: Left

## 2022-12-21 MED ORDER — ACETAMINOPHEN 500 MG PO TABS
ORAL_TABLET | ORAL | Status: AC
  Filled 2022-12-21: qty 2

## 2022-12-21 MED ORDER — LIDOCAINE HCL (CARDIAC) PF 100 MG/5ML IV SOSY
PREFILLED_SYRINGE | INTRAVENOUS | Status: DC | PRN
  Administered 2022-12-21: 60 mg via INTRAVENOUS

## 2022-12-21 MED ORDER — MIDAZOLAM HCL 2 MG/2ML IJ SOLN
INTRAMUSCULAR | Status: AC
  Filled 2022-12-21: qty 2

## 2022-12-21 MED ORDER — PROPOFOL 10 MG/ML IV BOLUS
INTRAVENOUS | Status: DC | PRN
  Administered 2022-12-21: 50 mg via INTRAVENOUS
  Administered 2022-12-21: 150 mg via INTRAVENOUS

## 2022-12-21 MED ORDER — FENTANYL CITRATE (PF) 100 MCG/2ML IJ SOLN
INTRAMUSCULAR | Status: DC | PRN
  Administered 2022-12-21 (×4): 25 ug via INTRAVENOUS

## 2022-12-21 MED ORDER — PROPOFOL 10 MG/ML IV BOLUS
INTRAVENOUS | Status: AC
  Filled 2022-12-21: qty 20

## 2022-12-21 MED ORDER — PROPOFOL 500 MG/50ML IV EMUL
INTRAVENOUS | Status: DC | PRN
Start: 2022-12-21 — End: 2022-12-21
  Administered 2022-12-21: 20 mg via INTRAVENOUS
  Administered 2022-12-21: 150 ug/kg/min via INTRAVENOUS

## 2022-12-21 MED ORDER — ONDANSETRON HCL 4 MG/2ML IJ SOLN
INTRAMUSCULAR | Status: AC
  Filled 2022-12-21: qty 2

## 2022-12-21 MED ORDER — DEXAMETHASONE SODIUM PHOSPHATE 10 MG/ML IJ SOLN
INTRAMUSCULAR | Status: DC | PRN
  Administered 2022-12-21: 10 mg via INTRAVENOUS

## 2022-12-21 MED ORDER — FENTANYL CITRATE (PF) 100 MCG/2ML IJ SOLN
INTRAMUSCULAR | Status: AC
  Filled 2022-12-21: qty 2

## 2022-12-21 MED ORDER — KETOROLAC TROMETHAMINE 30 MG/ML IJ SOLN: 30.0000 mg | Freq: Once | INTRAMUSCULAR | Status: DC | PRN

## 2022-12-21 MED ORDER — ONDANSETRON HCL 4 MG/2ML IJ SOLN
INTRAMUSCULAR | Status: DC | PRN
  Administered 2022-12-21: 4 mg via INTRAVENOUS

## 2022-12-21 MED ORDER — FENTANYL CITRATE (PF) 100 MCG/2ML IJ SOLN: 25.0000 ug | INTRAMUSCULAR | Status: DC | PRN

## 2022-12-21 MED ORDER — ACETAMINOPHEN 500 MG PO TABS
1000.0000 mg | ORAL_TABLET | ORAL | Status: AC
  Administered 2022-12-21: 1000 mg via ORAL

## 2022-12-21 MED ORDER — MIDAZOLAM HCL 5 MG/5ML IJ SOLN
INTRAMUSCULAR | Status: DC | PRN
  Administered 2022-12-21: 2 mg via INTRAVENOUS

## 2022-12-21 MED ORDER — CEFAZOLIN SODIUM-DEXTROSE 2-4 GM/100ML-% IV SOLN
INTRAVENOUS | Status: AC
  Filled 2022-12-21: qty 100

## 2022-12-21 MED ORDER — OXYCODONE HCL 5 MG/5ML PO SOLN: 5.0000 mg | Freq: Once | ORAL | Status: DC | PRN

## 2022-12-21 MED ORDER — LACTATED RINGERS IV SOLN: INTRAVENOUS | Status: DC

## 2022-12-21 MED ORDER — AMISULPRIDE (ANTIEMETIC) 5 MG/2ML IV SOLN: 10.0000 mg | Freq: Once | INTRAVENOUS | Status: DC | PRN

## 2022-12-21 MED ORDER — OXYCODONE HCL 5 MG PO TABS: 5.0000 mg | ORAL_TABLET | Freq: Once | ORAL | Status: DC | PRN

## 2022-12-21 MED ORDER — CEFAZOLIN SODIUM-DEXTROSE 2-4 GM/100ML-% IV SOLN
2.0000 g | INTRAVENOUS | Status: AC
  Administered 2022-12-21: 2 g via INTRAVENOUS

## 2022-12-21 MED ORDER — PROMETHAZINE HCL 25 MG/ML IJ SOLN: 6.2500 mg | INTRAMUSCULAR | Status: DC | PRN

## 2022-12-21 MED ORDER — BUPIVACAINE HCL (PF) 0.25 % IJ SOLN
INTRAMUSCULAR | Status: DC | PRN
  Administered 2022-12-21: 10 mL

## 2022-12-21 MED ORDER — LIDOCAINE 2% (20 MG/ML) 5 ML SYRINGE
INTRAMUSCULAR | Status: AC
  Filled 2022-12-21: qty 5

## 2022-12-21 MED ORDER — DEXAMETHASONE SODIUM PHOSPHATE 10 MG/ML IJ SOLN
INTRAMUSCULAR | Status: AC
  Filled 2022-12-21: qty 1

## 2022-12-21 MED ORDER — CELECOXIB 200 MG PO CAPS
400.0000 mg | ORAL_CAPSULE | ORAL | Status: AC
  Administered 2022-12-21: 200 mg via ORAL

## 2022-12-21 MED ORDER — CELECOXIB 200 MG PO CAPS
ORAL_CAPSULE | ORAL | Status: AC
  Filled 2022-12-21: qty 2

## 2022-12-21 SURGICAL SUPPLY — 54 items
ADH SKN CLS APL DERMABOND .7 (GAUZE/BANDAGES/DRESSINGS) ×1
APL PRP STRL LF DISP 70% ISPRP (MISCELLANEOUS) ×1
APPLIER CLIP 9.375 MED OPEN (MISCELLANEOUS)
APR CLP MED 9.3 20 MLT OPN (MISCELLANEOUS)
BINDER BREAST LRG (GAUZE/BANDAGES/DRESSINGS) IMPLANT
BINDER BREAST MEDIUM (GAUZE/BANDAGES/DRESSINGS) IMPLANT
BINDER BREAST XLRG (GAUZE/BANDAGES/DRESSINGS) IMPLANT
BINDER BREAST XXLRG (GAUZE/BANDAGES/DRESSINGS) IMPLANT
BLADE SURG 15 STRL LF DISP TIS (BLADE) ×1 IMPLANT
BLADE SURG 15 STRL SS (BLADE) ×1
CANISTER SUC SOCK COL 7IN (MISCELLANEOUS) IMPLANT
CANISTER SUCT 1200ML W/VALVE (MISCELLANEOUS) IMPLANT
CHLORAPREP W/TINT 26 (MISCELLANEOUS) ×1 IMPLANT
CLIP APPLIE 9.375 MED OPEN (MISCELLANEOUS) IMPLANT
CLIP TI WIDE RED SMALL 6 (CLIP) IMPLANT
COVER BACK TABLE 60X90IN (DRAPES) ×1 IMPLANT
COVER MAYO STAND STRL (DRAPES) ×1 IMPLANT
COVER PROBE CYLINDRICAL 5X96 (MISCELLANEOUS) ×1 IMPLANT
DERMABOND ADVANCED .7 DNX12 (GAUZE/BANDAGES/DRESSINGS) ×1 IMPLANT
DRAPE LAPAROSCOPIC ABDOMINAL (DRAPES) ×1 IMPLANT
DRAPE UTILITY XL STRL (DRAPES) ×1 IMPLANT
DRSG TEGADERM 4X4.75 (GAUZE/BANDAGES/DRESSINGS) IMPLANT
ELECT COATED BLADE 2.86 ST (ELECTRODE) ×1 IMPLANT
ELECT REM PT RETURN 9FT ADLT (ELECTROSURGICAL) ×1
ELECTRODE REM PT RTRN 9FT ADLT (ELECTROSURGICAL) ×1 IMPLANT
GAUZE SPONGE 4X4 12PLY STRL LF (GAUZE/BANDAGES/DRESSINGS) IMPLANT
GLOVE BIO SURGEON STRL SZ7 (GLOVE) ×2 IMPLANT
GLOVE BIOGEL PI IND STRL 7.5 (GLOVE) ×1 IMPLANT
GOWN STRL REUS W/ TWL LRG LVL3 (GOWN DISPOSABLE) ×2 IMPLANT
GOWN STRL REUS W/TWL LRG LVL3 (GOWN DISPOSABLE) ×2
HEMOSTAT ARISTA ABSORB 3G PWDR (HEMOSTASIS) IMPLANT
KIT MARKER MARGIN INK (KITS) ×1 IMPLANT
NDL HYPO 25X1 1.5 SAFETY (NEEDLE) ×1 IMPLANT
NEEDLE HYPO 25X1 1.5 SAFETY (NEEDLE) ×1
NS IRRIG 1000ML POUR BTL (IV SOLUTION) IMPLANT
PACK BASIN DAY SURGERY FS (CUSTOM PROCEDURE TRAY) ×1 IMPLANT
PENCIL SMOKE EVACUATOR (MISCELLANEOUS) ×1 IMPLANT
RETRACTOR ONETRAX LX 90X20 (MISCELLANEOUS) IMPLANT
SLEEVE SCD COMPRESS KNEE MED (STOCKING) ×1 IMPLANT
SPIKE FLUID TRANSFER (MISCELLANEOUS) IMPLANT
SPONGE T-LAP 4X18 ~~LOC~~+RFID (SPONGE) ×1 IMPLANT
STRIP CLOSURE SKIN 1/2X4 (GAUZE/BANDAGES/DRESSINGS) ×1 IMPLANT
SUT MNCRL AB 4-0 PS2 18 (SUTURE) IMPLANT
SUT MON AB 5-0 PS2 18 (SUTURE) IMPLANT
SUT SILK 2 0 SH (SUTURE) IMPLANT
SUT VIC AB 2-0 SH 27 (SUTURE) ×1
SUT VIC AB 2-0 SH 27XBRD (SUTURE) ×1 IMPLANT
SUT VIC AB 3-0 SH 27 (SUTURE) ×1
SUT VIC AB 3-0 SH 27X BRD (SUTURE) ×1 IMPLANT
SYR CONTROL 10ML LL (SYRINGE) ×1 IMPLANT
TOWEL GREEN STERILE FF (TOWEL DISPOSABLE) ×1 IMPLANT
TRAY FAXITRON CT DISP (TRAY / TRAY PROCEDURE) ×1 IMPLANT
TUBE CONNECTING 20X1/4 (TUBING) IMPLANT
YANKAUER SUCT BULB TIP NO VENT (SUCTIONS) IMPLANT

## 2022-12-21 NOTE — Anesthesia Procedure Notes (Signed)
Procedure Name: LMA Insertion Date/Time: 12/21/2022 12:27 PM  Performed by: Lauralyn Primes, CRNAPre-anesthesia Checklist: Patient identified, Emergency Drugs available, Suction available and Patient being monitored Patient Re-evaluated:Patient Re-evaluated prior to induction Oxygen Delivery Method: Circle system utilized Preoxygenation: Pre-oxygenation with 100% oxygen Induction Type: IV induction Ventilation: Mask ventilation without difficulty LMA: LMA inserted LMA Size: 4.0 Number of attempts: 1 Airway Equipment and Method: Bite block Placement Confirmation: positive ETCO2 Tube secured with: Tape Dental Injury: Teeth and Oropharynx as per pre-operative assessment

## 2022-12-21 NOTE — Discharge Instructions (Addendum)
[facility] Office Phone Number [phone]  POST OP INSTRUCTIONS Take 400 mg of ibuprofen every 8 hours or 650 mg tylenol every 6 hours for next 72 hours then as needed. Use ice several times daily also.  A prescription for pain medication may be given to you upon discharge.  Take your pain medication as prescribed, if needed.  If narcotic pain medicine is not needed, then you may take acetaminophen (Tylenol), naprosyn (Alleve) or ibuprofen (Advil) as needed. Take your usually prescribed medications unless otherwise directed If you need a refill on your pain medication, please contact your pharmacy.  They will contact our office to request authorization.  Prescriptions will not be filled after 5pm or on week-ends. You should eat very light the first 24 hours after surgery, such as soup, crackers, pudding, etc.  Resume your normal diet the day after surgery. Most patients will experience some swelling and bruising in the breast.  Ice packs and a good support bra will help.  Wear the breast binder provided or a sports bra for 72 hours day and night.  After that wear a sports bra during the day until you return to the office. Swelling and bruising can take several days to resolve.  It is common to experience some constipation if taking pain medication after surgery.  Increasing fluid intake and taking a stool softener will usually help or prevent this problem from occurring.  A mild laxative (Milk of Magnesia or Miralax) should be taken according to package directions if there are no bowel movements after 48 hours. I used skin glue on the incision, you may shower in 24 hours.  The glue will flake off over the next 2-3 weeks.  Any sutures or staples will be removed at the office during your follow-up visit. ACTIVITIES:  You may resume regular daily activities (gradually increasing) beginning the next day.  Wearing a good support bra or sports bra minimizes pain and swelling.  You may have  sexual intercourse when it is comfortable. You may drive when you no longer are taking prescription pain medication, you can comfortably wear a seatbelt, and you can safely maneuver your car and apply brakes. RETURN TO WORK:  ______________________________________________________________________________________ Bonita Quin should see your doctor in the office for a follow-up appointment approximately two weeks after your surgery.  Your doctor's nurse will typically make your follow-up appointment when she calls you with your pathology report.  Expect your pathology report 3-4 business days after your surgery.  You may call to check if you do not hear from [place] after three days. OTHER INSTRUCTIONS: _______________________________________________________________________________________________ _____________________________________________________________________________________________________________________________________ _____________________________________________________________________________________________________________________________________ _____________________________________________________________________________________________________________________________________  WHEN TO CALL DR WAKEFIELD: Fever over 101.0 Nausea and/or vomiting. Extreme swelling or bruising. Continued bleeding from incision. Increased pain, redness, or drainage from the incision.  The clinic staff is available to answer your questions during regular business hours.  Please don't hesitate to call and ask to speak to one of the nurses for clinical concerns.  If you have a medical emergency, go to the nearest emergency room or call 911.  A surgeon from [facility] is always on call at the hospital.  For further questions, please visit [email or web address] May have tylenol at 5:29 if needed.   Post Anesthesia Home Care Instructions  Activity: Get plenty of rest for the remainder of the day. A  responsible individual must stay with you for 24 hours following the procedure.  For the next 24 hours, DO NOT: -Drive a car -Advertising copywriter -Drink alcoholic  beverages -Take any medication unless instructed by your physician -Make any legal decisions or sign important papers.  Meals: Start with liquid foods such as gelatin or soup. Progress to regular foods as tolerated. Avoid greasy, spicy, heavy foods. If nausea and/or vomiting occur, drink only clear liquids until the nausea and/or vomiting subsides. Call your physician if vomiting continues.  Special Instructions/Symptoms: Your throat may feel dry or sore from the anesthesia or the breathing tube placed in your throat during surgery. If this causes discomfort, gargle with warm salt water. The discomfort should disappear within 24 hours.  If you had a scopolamine patch placed behind your ear for the management of post- operative nausea and/or vomiting:  1. The medication in the patch is effective for 72 hours, after which it should be removed.  Wrap patch in a tissue and discard in the trash. Wash hands thoroughly with soap and water. 2. You may remove the patch earlier than 72 hours if you experience unpleasant side effects which may include dry mouth, dizziness or visual disturbances. 3. Avoid touching the patch. Wash your hands with soap and water after contact with the patch.

## 2022-12-21 NOTE — Anesthesia Postprocedure Evaluation (Signed)
Anesthesia Post Note  Patient: Tracey Mcdaniel  Procedure(s) Performed: LEFT BREAST SEED GUIDED EXCISIONAL BIOPSY (Left: Breast)     Patient location during evaluation: PACU Anesthesia Type: General Level of consciousness: awake Pain management: pain level controlled Vital Signs Assessment: post-procedure vital signs reviewed and stable Respiratory status: spontaneous breathing, nonlabored ventilation and respiratory function stable Cardiovascular status: blood pressure returned to baseline and stable Postop Assessment: no apparent nausea or vomiting Anesthetic complications: no   No notable events documented.  Last Vitals:  Vitals:   12/21/22 1330 12/21/22 1405  BP: 120/69 118/77  Pulse: 64 88  Resp: 15 18  Temp:  (!) 36.3 C  SpO2:  98%    Last Pain:  Vitals:   12/21/22 1405  TempSrc: Temporal  PainSc: 3                  Lanaya Bennis P Miyana Mordecai

## 2022-12-21 NOTE — Transfer of Care (Signed)
Immediate Anesthesia Transfer of Care Note  Patient: Tracey Mcdaniel  Procedure(s) Performed: LEFT BREAST SEED GUIDED EXCISIONAL BIOPSY (Left: Breast)  Patient Location: PACU  Anesthesia Type:General  Level of Consciousness: drowsy  Airway & Oxygen Therapy: Patient Spontanous Breathing and Patient connected to face mask oxygen  Post-op Assessment: Report given to RN and Post -op Vital signs reviewed and stable  Post vital signs: Reviewed and stable  Last Vitals:  Vitals Value Taken Time  BP 114/73 (84)   Temp    Pulse 79   Resp 15   SpO2 100     Last Pain:  Vitals:   12/21/22 1121  TempSrc: Tympanic  PainSc: 0-No pain      Patients Stated Pain Goal: 5 (12/21/22 1121)  Complications: No notable events documented.

## 2022-12-21 NOTE — Interval H&P Note (Signed)
History and Physical Interval Note:  12/21/2022 12:01 PM  Tracey Mcdaniel  has presented today for surgery, with the diagnosis of LEFT BREAST MASS.  The various methods of treatment have been discussed with the patient and family. After consideration of risks, benefits and other options for treatment, the patient has consented to  Procedure(s) with comments: LEFT BREAST SEED GUIDED EXCISIONAL BIOPSY (Left) - LMA as a surgical intervention.  The patient's history has been reviewed, patient examined, no change in status, stable for surgery.  I have reviewed the patient's chart and labs.  Questions were answered to the patient's satisfaction.     Emelia Loron

## 2022-12-21 NOTE — Op Note (Addendum)
Preoperative diagnosis: Left breast mammographic mass with core biopsy c/w csl postoperative diagnosis: Same as above Procedure: Left breast radioactive seed guided excisional biopsy Surgeon: Dr Harden Mo Anesthesia: General Estimated blood loss: Minimal Complications: None Drains: None Specimens: Left  breast tissue containing seed and clip marked. Sponge and count was correct completion Disposition recovery stable condition   Indications: 57 year old female who underwent a screening mammogram in July. This showed C density breast tissue. There was a 2.8 x 2.9 cm asymmetry in the retroareolar left breast. She was then evaluated at [facility] A diagnostic mammogram showed a 7 mm mass with a 2.7 cm area of distortion around it. Ultrasound was then done. This showed a 7 x 3 mm mass with a spiculated margin the left axilla was negative. She underwent a stereotactic guided biopsy with clip placement. 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  She was given antibiotics.  SCDs were placed.  She was placed under general anesthesia without complication.  She was prepped and draped in the standard sterile surgical fashion.  A surgical timeout was then performed.   I located the seed in the central breast.  I then made a periareolar incision in order to hide the scar later.  I infiltrated this whole area with Marcaine.  I then dissected the seed.  The seed and some of the surrounding tissue were removed.  Mammogram confirmed removal of the seed and the clip.  I then obtained hemostasis.  I closed the breast tissue with 2-0 Vicryl.  The skin was closed with 3-0 Vicryl and 5-0 Monocryl.  Glue and Steri-Strips were applied.  She tolerated this well was extubated and transferred recovery stable

## 2022-12-21 NOTE — H&P (Signed)
  57 year old female who has a history of hypertension but is otherwise healthy. She has no family history of any breast cancer. She has no real prior breast history. She had some left breast pain in February that resolved. She then underwent a screening mammogram in July. This showed C density breast tissue. There was a 2.8 x 2.9 cm asymmetry in the retroareolar left breast. She was then evaluated at [facility]. A diagnostic mammogram showed a 7 mm mass with a 2.7 cm area of distortion around it. Ultrasound was then done. This showed a 7 x 3 cm mass with a spiculated margin the left axilla was negative. She underwent a stereotactic guided biopsy with clip placement. 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 She was then referred for evaluation. She has no discharge. She works as a Retail buyer in high school  Review of Systems: A complete review of systems was obtained from the patient. I have reviewed this information and discussed as appropriate with the patient. See HPI as well for other ROS.  Review of Systems  All other systems reviewed and are negative.  Medical History: History reviewed. No pertinent past medical history.  History reviewed. No pertinent surgical history.   No Known Allergies  Current Outpatient Medications on File Prior to Visit  Medication Sig Dispense Refill  finasteride (PROSCAR) 5 mg tablet Take 5 mg by mouth once daily  magnesium oxide (MAG-OX) 400 mg (241.3 mg magnesium) tablet Take 400 mg by mouth once daily  multivitamin tablet Take 1 tablet by mouth once daily  triamterene-hydroCHLOROthiazide (MAXZIDE-25) 37.5-25 mg tablet Take 1 tablet by mouth once daily  valsartan (DIOVAN) 160 MG tablet Take 160 mg by mouth once daily    Family History  Problem Relation Age of Onset  Coronary Artery Disease (Blocked arteries around heart) Mother  Skin cancer Father   Social History   Tobacco Use  Smoking Status Never  Smokeless Tobacco Never  Marital  status: Married  Tobacco Use  Smoking status: Never  Smokeless tobacco: Never  Substance and Sexual Activity  Alcohol use: Never  Drug use: Never   Objective:   Vitals:  12/08/22 0902  BP: 124/78  Pulse: (!) 113  Temp: 36.8 C (98.3 F)  SpO2: 98%  Weight: 78.8 kg (173 lb 12.8 oz)  Height: 171.5 cm (5' 7.5")  PainSc: 0-No pain  PainLoc: Breast   Body mass index is 26.82 kg/m.  Physical Exam Vitals reviewed.  Constitutional:  Appearance: Normal appearance.  Chest:  Breasts: Left: No inverted nipple, mass or nipple discharge.  Lymphadenopathy:  Upper Body:  Left upper body: No supraclavicular or axillary adenopathy.  Neurological:  Mental Status: She is alert.    Assessment and Plan:   Radial scar of breast  Left breast radioactive seed guided excisional biopsy  We discussed options. Certainly is an option to observe but I think that that is not the best plan for this. We discussed a radioactive seed guided incisional biopsy with the possible outcomes as well as the risks and recovery associated with that. She is agreeable to proceed.

## 2022-12-21 NOTE — Anesthesia Preprocedure Evaluation (Addendum)
Anesthesia Evaluation  Patient identified by MRN, date of birth, ID band Patient awake    Reviewed: Allergy & Precautions, NPO status , Patient's Chart, lab work & pertinent test results  Airway Mallampati: III  TM Distance: >3 FB Neck ROM: Full    Dental no notable dental hx.    Pulmonary neg pulmonary ROS   Pulmonary exam normal        Cardiovascular hypertension, Pt. on medications Normal cardiovascular exam     Neuro/Psych   Anxiety     negative neurological ROS     GI/Hepatic negative GI ROS, Neg liver ROS,,,  Endo/Other  negative endocrine ROS    Renal/GU negative Renal ROS     Musculoskeletal negative musculoskeletal ROS (+)    Abdominal   Peds  Hematology negative hematology ROS (+)   Anesthesia Other Findings LEFT BREAST MASS  Reproductive/Obstetrics                             Anesthesia Physical Anesthesia Plan  ASA: 2  Anesthesia Plan: General   Post-op Pain Management:    Induction: Intravenous  PONV Risk Score and Plan: 3 and Ondansetron, Dexamethasone, Midazolam, Treatment may vary due to age or medical condition and Propofol infusion  Airway Management Planned: LMA  Additional Equipment:   Intra-op Plan:   Post-operative Plan: Extubation in OR  Informed Consent: I have reviewed the patients History and Physical, chart, labs and discussed the procedure including the risks, benefits and alternatives for the proposed anesthesia with the patient or authorized representative who has indicated his/her understanding and acceptance.     Dental advisory given  Plan Discussed with: CRNA  Anesthesia Plan Comments:         Anesthesia Quick Evaluation

## 2022-12-21 NOTE — Progress Notes (Signed)
Patient being discharged  home this nurse and the nurse giving discharge instruction noticed no narcotic pain medication sent to pharmacy, this nurse called Dr Dwain Sarna to ask if he would send pain medication to pharmacy. Dr Dwain Sarna stated that he does not send any narcotic pain medication for this type of surgery and advised patient to take tylenol, Advil and ice the surgical site. This nurse relayed message to the patient and patients family. Patient stated she did not need any pain medication at this time

## 2022-12-22 ENCOUNTER — Encounter (HOSPITAL_BASED_OUTPATIENT_CLINIC_OR_DEPARTMENT_OTHER): Payer: Self-pay | Admitting: General Surgery

## 2022-12-23 LAB — SURGICAL PATHOLOGY

## 2023-06-16 ENCOUNTER — Other Ambulatory Visit: Payer: Self-pay | Admitting: Cardiology

## 2023-06-21 ENCOUNTER — Other Ambulatory Visit: Payer: Self-pay | Admitting: Cardiology

## 2023-07-12 ENCOUNTER — Other Ambulatory Visit: Payer: Self-pay

## 2023-07-12 MED ORDER — TRIAMTERENE-HCTZ 37.5-25 MG PO TABS: 1.0000 | ORAL_TABLET | Freq: Every day | ORAL | 0 refills | Status: DC

## 2023-07-12 MED ORDER — VALSARTAN 160 MG PO TABS: 160.0000 mg | ORAL_TABLET | Freq: Every day | ORAL | 0 refills | Status: DC

## 2023-07-26 ENCOUNTER — Other Ambulatory Visit: Payer: Self-pay

## 2023-07-26 MED ORDER — VALSARTAN 160 MG PO TABS: 160.0000 mg | ORAL_TABLET | Freq: Every day | ORAL | 0 refills | Status: DC

## 2023-08-16 ENCOUNTER — Ambulatory Visit: Attending: Cardiology | Admitting: Cardiology

## 2023-08-16 ENCOUNTER — Encounter: Payer: Self-pay | Admitting: Cardiology

## 2023-08-16 ENCOUNTER — Other Ambulatory Visit: Payer: Self-pay

## 2023-08-16 VITALS — BP 136/82 | HR 80 | Ht 67.5 in | Wt 179.8 lb

## 2023-08-16 DIAGNOSIS — R002 Palpitations: Secondary | ICD-10-CM

## 2023-08-16 DIAGNOSIS — I10 Essential (primary) hypertension: Secondary | ICD-10-CM

## 2023-08-16 MED ORDER — METOPROLOL SUCCINATE ER 25 MG PO TB24: 25.0000 mg | ORAL_TABLET | Freq: Every day | ORAL | 3 refills | Status: AC

## 2023-08-16 MED ORDER — TRIAMTERENE-HCTZ 37.5-25 MG PO TABS: 1.0000 | ORAL_TABLET | Freq: Every day | ORAL | 3 refills | Status: AC

## 2023-08-16 MED ORDER — VALSARTAN 160 MG PO TABS: 160.0000 mg | ORAL_TABLET | Freq: Every day | ORAL | 3 refills | Status: AC

## 2023-08-16 NOTE — Patient Instructions (Signed)
 Medication Instructions:   START Toprol  XL 25 mg daily  Labwork: None today  Testing/Procedures: None today  Follow-Up: 1 year  Any Other Special Instructions Will Be Listed Below (If Applicable).  If you need a refill on your cardiac medications before your next appointment, please call your pharmacy.

## 2023-08-16 NOTE — Progress Notes (Signed)
    Cardiology Office Note  Date: 08/16/2023   ID: Eline Chaparro, DOB 06-27-1965, MRN [id number]  History of Present Illness: Tracey Mcdaniel is a 58 y.o. female last seen in January 2024.  She is here for a routine visit.  Reports intermittent palpitations consistent with prior documented PVCs, no increasing pattern, no associated dizziness or syncope, no chest pain.  These tend to be fairly sporadic.  We went over her medications.  She reports compliance with treatment.  She has gained some weight since having breast surgery (ultimately benign mass) last year, was not as active subsequently.  Her blood pressure has been trending up some as well.  We discussed trial of low-dose Toprol -XL to address both palpitations and blood pressure.  Also went over diet and exercise measures.  I did review her interval lab work which is noted below.  I reviewed her ECG today which shows normal sinus rhythm.  Physical Exam: VS:  BP 136/82   Pulse 80   Ht 5' 7.5" (1.715 m)   Wt 179 lb 12.8 oz (81.6 kg)   SpO2 98%   BMI 27.75 kg/m , BMI Body mass index is 27.75 kg/m.  Wt Readings from Last 3 Encounters:  08/16/23 179 lb 12.8 oz (81.6 kg)  12/21/22 171 lb 8.3 oz (77.8 kg)  05/11/22 183 lb (83 kg)    General: Patient appears comfortable at rest. HEENT: Conjunctiva and lids normal. Neck: Supple, no elevated JVP. Lungs: Clear to auscultation, nonlabored breathing at rest. Cardiac: Regular rate and rhythm, no S3 or significant systolic murmur, no pericardial rub. Extremities: No pitting edema.  ECG:  An ECG dated 05/11/2022 was personally reviewed today and demonstrated:  Sinus rhythm.  Labwork: 12/20/2022: BUN 20; Creatinine, Ser 0.57; Potassium 3.6; Sodium 135   Other Studies Reviewed Today:  No interval cardiac testing for review today.  Assessment and Plan:  1.  History of palpitations with previously documented PVCs.  ECG normal today.  No associated dizziness or  syncope.  Plan to add Toprol -XL 25 mg daily and observe.  2.  Primary hypertension.  We have diet and exercise plan.  Adding Toprol -XL 25 mg daily as discussed above.  Otherwise continue Diovan  160 mg daily and Maxide 37.5/25 mg daily.  Disposition:  Follow up  1 year.  Signed, Gerard Knight, M.D., F.A.C.C[facility]

## 2024-04-10 ENCOUNTER — Ambulatory Visit: Admitting: Obstetrics and Gynecology

## 2024-04-22 ENCOUNTER — Encounter: Payer: Self-pay | Admitting: *Deleted
